# Patient Record
Sex: Female | Born: 1975 | Hispanic: Yes | Marital: Married | Smoking: Never smoker
Health system: Southern US, Community
[De-identification: ages and names within clinical notes are randomized; demographics above are authoritative.]

## PROBLEM LIST (undated history)

## (undated) DIAGNOSIS — O24419 Gestational diabetes mellitus in pregnancy, unspecified control: Secondary | ICD-10-CM

---
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
## 1998-04-22 DIAGNOSIS — O24419 Gestational diabetes mellitus in pregnancy, unspecified control: Secondary | ICD-10-CM

## 1998-04-22 HISTORY — DX: Gestational diabetes mellitus in pregnancy, unspecified control: O24.419

## 2008-04-22 HISTORY — PX: LEEP: SHX91

## 2018-02-19 DIAGNOSIS — F4321 Adjustment disorder with depressed mood: Secondary | ICD-10-CM | POA: Insufficient documentation

## 2018-02-25 DIAGNOSIS — Z87898 Personal history of other specified conditions: Secondary | ICD-10-CM | POA: Insufficient documentation

## 2018-02-25 DIAGNOSIS — E669 Obesity, unspecified: Secondary | ICD-10-CM | POA: Insufficient documentation

## 2018-02-25 DIAGNOSIS — Z62810 Personal history of physical and sexual abuse in childhood: Secondary | ICD-10-CM | POA: Insufficient documentation

## 2018-02-26 ENCOUNTER — Other Ambulatory Visit: Payer: Self-pay | Admitting: Advanced Practice Midwife

## 2018-02-26 DIAGNOSIS — Z3481 Encounter for supervision of other normal pregnancy, first trimester: Secondary | ICD-10-CM

## 2018-02-26 LAB — OB RESULTS CONSOLE HEPATITIS B SURFACE ANTIGEN: Hepatitis B Surface Ag: NEGATIVE

## 2018-02-27 LAB — OB RESULTS CONSOLE GC/CHLAMYDIA
Chlamydia: NEGATIVE
Gonorrhea: NEGATIVE

## 2018-03-04 ENCOUNTER — Other Ambulatory Visit: Payer: Self-pay | Admitting: Advanced Practice Midwife

## 2018-03-04 ENCOUNTER — Ambulatory Visit
Admission: RE | Admit: 2018-03-04 | Discharge: 2018-03-04 | Disposition: A | Discharge status: Home / Self Care | Payer: BLUE CROSS/BLUE SHIELD | Source: Ambulatory Visit | Attending: Advanced Practice Midwife | Admitting: Advanced Practice Midwife

## 2018-03-04 DIAGNOSIS — Z3A12 12 weeks gestation of pregnancy: Secondary | ICD-10-CM | POA: Diagnosis not present

## 2018-03-04 DIAGNOSIS — Z3481 Encounter for supervision of other normal pregnancy, first trimester: Secondary | ICD-10-CM

## 2018-03-04 DIAGNOSIS — O09521 Supervision of elderly multigravida, first trimester: Secondary | ICD-10-CM | POA: Insufficient documentation

## 2018-03-09 ENCOUNTER — Other Ambulatory Visit: Payer: Self-pay | Admitting: Advanced Practice Midwife

## 2018-03-09 DIAGNOSIS — Z369 Encounter for antenatal screening, unspecified: Secondary | ICD-10-CM

## 2018-03-16 ENCOUNTER — Ambulatory Visit
Admission: RE | Admit: 2018-03-16 | Discharge: 2018-03-16 | Disposition: A | Discharge status: Home / Self Care | Payer: BLUE CROSS/BLUE SHIELD | Source: Ambulatory Visit | Attending: Advanced Practice Midwife | Admitting: Advanced Practice Midwife

## 2018-03-16 ENCOUNTER — Ambulatory Visit (HOSPITAL_BASED_OUTPATIENT_CLINIC_OR_DEPARTMENT_OTHER)
Admission: RE | Admit: 2018-03-16 | Discharge: 2018-03-16 | Disposition: A | Discharge status: Home / Self Care | Payer: BLUE CROSS/BLUE SHIELD | Source: Ambulatory Visit | Attending: Maternal & Fetal Medicine | Admitting: Maternal & Fetal Medicine

## 2018-03-16 VITALS — BP 119/78 | HR 85 | Temp 98.0°F | Resp 17 | Ht 60.0 in | Wt 195.0 lb

## 2018-03-16 DIAGNOSIS — Z369 Encounter for antenatal screening, unspecified: Secondary | ICD-10-CM

## 2018-03-16 DIAGNOSIS — Z3A13 13 weeks gestation of pregnancy: Secondary | ICD-10-CM | POA: Diagnosis not present

## 2018-03-16 DIAGNOSIS — Z3689 Encounter for other specified antenatal screening: Secondary | ICD-10-CM | POA: Insufficient documentation

## 2018-03-16 DIAGNOSIS — O09521 Supervision of elderly multigravida, first trimester: Secondary | ICD-10-CM | POA: Diagnosis not present

## 2018-03-16 NOTE — Progress Notes (Signed)
Pt seen by me, agree with assessment and plan as outlined in CGC Wells's note 

## 2018-03-16 NOTE — Progress Notes (Signed)
Referring provider:  [facility] Department Length of consultation:  65 minutes  We had the pleasure of seeing Ms. Gina Bell at [facility] on March 16, 2018. She attended the appointment unaccompanied. A Spanish interpreter was also present for the session. Ms. Gina Bell was referred for advanced maternal age, as she will be 42 years old at the time of delivery. The following is a summary of the topics discussed during the session. The patient was counseled by Leatrice Jewels, genetic counseling intern, supervised by Katrina Stack, MS, CGC.   Family History   As part of the visit, a three-generation pedigree for Ms. Gina Bell and her partner was obtained. The family history is summarized below.   Ms. Gina Bell is G4P3 and will be 42 years old at delivery. Her EDD is 09/15/2018, giving a gestational age of [redacted] weeks and 6 days at the time of the session. She reports that she and her partner are generally in good health. Ms. Gina Bell reports that she has three living children, two boys and one girl. The two boys have children who are alive and well.   Ms. Roque Lias Gallardo's brother has three children. She reports that her oldest niece was born with a "twisted hip" and underwent surgery as a child. She was not aware of a specific cause or diagnosis. It is possible that this represents a congenital dislocation of the hip, and if so, given that this niece is a third degree relative to the pregnancy, we would not anticipate the current pregnancy to be at increased risk for this condition. If additional information regarding the diagnosis or medical records become available, we would be happy to review them.   She also reports that her brother's other daughter was born with Down syndrome. She is not aware of whether her niece or her brother had genetic testing to define the cytogenetic cause of her Down syndrome diagnosis. We discussed that about 95% of  the time Down syndrome is caused by a sporadic genetic event that happened in the formation of the sperm or egg cell and is not associated with a genetic change in the parents. If this were the cause of the niece's Down syndrome, we would not expect this pregnancy to be at an increased risk for Down syndrome beyond her age-related risk. If additional information regarding the diagnosis or medical records become available, we would be happy to review them to exclude the possibility of a translocation which could increase the risk to other family members. The age-related risks for chromosome anomalies are addressed below.   Another of Ms. Roque Lias Gallardo's brothers experienced 5 miscarriages with his partner before the birth of their only child. There can be many causes of miscarriage, and genetic factors can play a role. Given this history, Ms. brother and his partner could seek a genetic counseling evaluation if they would like additional information regarding the possible causes of the recurrent pregnancy losses. Because our patient has not experienced any pregnancy losses, we would not pursue any testing for this family history.   The patient also reports that her partner's mother was diagnosed at 32 with stomach cancer which required surgery and has recently relapsed and required thyroid surgery. She also reports that two of his maternal aunts were diagnosed with uterine cancer in their 37s. We discussed that cancer diagnoses can be sporadic or due to an inherited genetic predisposition. In families with inherited cancer predisposition, it is common for multiple individuals to  be affected at early ages and in multiple generations. If individuals in his family desire, they could seek a consultation with a genetic counselor that specializes in cancer.   The remainder of the family histories are otherwise negative for birth defects, intellectual disability, birth defects, or other diagnosed genetic conditions.  They report [place] heritage with no known Ashkenazi Jewish ancestry. Consanguinity is denied.   Chromosome Anomalies in Pregnancy   During the visit, we also reviewed the options for screening or diagnostic testing of chromosome anomalies in the pregnancy. These screens and tests assess the chance that the pregnancy could be affected by one of several chromosome anomalies, including Down syndrome, trisomy 6313 (also referred to as Patau syndrome), trisomy 6318 (also referred to as Ramon DredgeEdward syndrome), as well as anomalies in the number of the sex chromosomes. While there is the potential for any pregnancy to be affected by these conditions, the chance increases with maternal age. Ms. Gina Mileizano Gallardo will be 42 years old at the time of delivery. Her age-related risk at term for any chromosome anomaly is 1 in 4063 (1.6%), and for Down syndrome in particular is 1 in 98 (1.0%).   First Trimester Screening - this option estimates a numerical probability for chromosome conditions by testing levels of three hormones associated with pregnancy that are present in maternal blood. Specific patterns of these hormones have been associated with a higher likelihood of the incidence of chromosome anomalies, however it is not diagnostic. This information is combined with information from the first trimester ultrasound. The nuchal translucency (NT) measurement is also incorporated to calculate the risk for chromosomal anomalies in the pregnancy. This screen is approximately 90% sensitive for Down syndrome and 95% sensitive for trisomy 5113 and trisomy 6218. It also carries a 5% false positive rate. Abnormal results on this testing would lead to additional diagnostic testing being made available.   Non-invasive prenatal screening - also referred to as cell-free DNA testing (cfDNA). This screen involves sequencing DNA fragments present in the maternal blood. A percentage of these fragments are placental in origin. Sequence fragments  belonging to chromosomes 13, 18, 21, and the sex chromosomes (if desired) are counted. In the case that the measured amount from a chromosome differs from the amount expected, this is highly suggestive of a change in the number of chromosomes in the pregnancy. This screen is >99% sensitive for Down syndrome, >97% sensitive for trisomy 18, and >91% sensitive for trisomy 13. It also carries a 1% false positive rate. While this test is more sensitive for these conditions, it remains a screening test and it is recommended that results from this screen be confirmed with another testing method.   Diagnostic testing - involves obtaining placental or fetal cells through an invasive method, typically chorionic villus sampling (CVS) or amniocentesis, depending on the gestational age. CVS is typically be performed between 10- and 13-weeks gestation. Amniocentesis is typically performed starting at 15-weeks gestation. Cells obtained through these methods can be analyzed via a number of cytogenetic or molecular methods. Chromosomal karyotype can be obtained to detect >99.5% of chromosomal aneuploidy. The invasive methods do carry a risk of pregnancy complications up to and including miscarriage. The rate of complications associated with CVS is approximately 1%, while amniocentesis carries an approximately 0.5% risk of complication.   Carrier screening - we also offered the option of carrier screening for Cystic fibrosis and Spinal Muscular Atrophy per ACOG Guidelines.  The patient had prior hemoglobinopathy screening which was normal.  She declined  screening for CF and SMA.   Assessment and Plan   1. Ms. Gina Bell decided to proceed with non-invasive prenatal screening. Testing should be completed within 7-10 days and we will call the patient to return the results.  2. Ms. Gina Bell declined carrier screening for autosomal recessive conditions. If she would like to pursue testing at a later time, she is  welcome to contact us at any time.   It was a pleasure to meet Ms. Gina Bell today and to participate in her care. Please feel free to contact us if you have any additional questions. We may be reached at [phone].   Cherly Anderson, MS, CGC  Labs ordered:  MaterniT21 PLUS with SCA

## 2018-03-22 LAB — MATERNIT21 PLUS CORE+SCA
CHROMOSOME 13: NEGATIVE
CHROMOSOME 18: NEGATIVE
CHROMOSOME 21: NEGATIVE
Y CHROMOSOME: DETECTED

## 2018-03-23 ENCOUNTER — Telehealth: Payer: Self-pay | Admitting: Obstetrics and Gynecology

## 2018-03-23 NOTE — Telephone Encounter (Signed)
The patient was informed of the results of her recent MaterniT21 testing which yielded NEGATIVE results.  The patient's specimen showed DNA consistent with two copies of chromosomes 21, 18 and 13.  The sensitivity for trisomy 21, trisomy 18 and trisomy 13 using this testing are reported as 99.1%, 99.9% and 91.7% respectively.  Thus, while the results of this testing are highly accurate, they are not considered diagnostic at this time.  Should more definitive information be desired, the patient may still consider amniocentesis.   As requested to know by the patient, sex chromosome analysis was included for this sample.  Results are consistent with a female fetus (Y chromosome material was detected). This is predicted with >99% accuracy.  A maternal serum AFP only should be considered if screening for neural tube defects is desired.  We may be reached at [phone] with any questions or concerns.   Annet Manukyan F. Nate Common, MS, CGC   

## 2018-03-24 ENCOUNTER — Emergency Department
Admission: EM | Admit: 2018-03-24 | Discharge: 2018-03-24 | Disposition: A | Discharge status: Home / Self Care | Payer: BLUE CROSS/BLUE SHIELD | Attending: Emergency Medicine | Admitting: Emergency Medicine

## 2018-03-24 ENCOUNTER — Encounter: Payer: Self-pay | Admitting: Emergency Medicine

## 2018-03-24 ENCOUNTER — Emergency Department: Payer: BLUE CROSS/BLUE SHIELD

## 2018-03-24 ENCOUNTER — Other Ambulatory Visit: Payer: Self-pay

## 2018-03-24 DIAGNOSIS — O219 Vomiting of pregnancy, unspecified: Secondary | ICD-10-CM | POA: Diagnosis not present

## 2018-03-24 DIAGNOSIS — N23 Unspecified renal colic: Secondary | ICD-10-CM

## 2018-03-24 DIAGNOSIS — M545 Low back pain: Secondary | ICD-10-CM | POA: Diagnosis not present

## 2018-03-24 DIAGNOSIS — Z3A15 15 weeks gestation of pregnancy: Secondary | ICD-10-CM | POA: Insufficient documentation

## 2018-03-24 DIAGNOSIS — R1031 Right lower quadrant pain: Secondary | ICD-10-CM | POA: Diagnosis not present

## 2018-03-24 DIAGNOSIS — O9989 Other specified diseases and conditions complicating pregnancy, childbirth and the puerperium: Secondary | ICD-10-CM | POA: Insufficient documentation

## 2018-03-24 LAB — CBC WITH DIFFERENTIAL/PLATELET
Abs Immature Granulocytes: 0.07 10*3/uL (ref 0.00–0.07)
BASOS PCT: 0 %
Basophils Absolute: 0 10*3/uL (ref 0.0–0.1)
EOS ABS: 0.2 10*3/uL (ref 0.0–0.5)
EOS PCT: 1 %
HEMATOCRIT: 38.2 % (ref 36.0–46.0)
Hemoglobin: 13 g/dL (ref 12.0–15.0)
Immature Granulocytes: 1 %
LYMPHS ABS: 1.3 10*3/uL (ref 0.7–4.0)
Lymphocytes Relative: 9 %
MCH: 30.7 pg (ref 26.0–34.0)
MCHC: 34 g/dL (ref 30.0–36.0)
MCV: 90.1 fL (ref 80.0–100.0)
MONO ABS: 0.7 10*3/uL (ref 0.1–1.0)
Monocytes Relative: 5 %
Neutro Abs: 11.9 10*3/uL — ABNORMAL HIGH (ref 1.7–7.7)
Neutrophils Relative %: 84 %
Platelets: 337 10*3/uL (ref 150–400)
RBC: 4.24 MIL/uL (ref 3.87–5.11)
RDW: 14.6 % (ref 11.5–15.5)
WBC: 14.2 10*3/uL — ABNORMAL HIGH (ref 4.0–10.5)
nRBC: 0 % (ref 0.0–0.2)

## 2018-03-24 LAB — COMPREHENSIVE METABOLIC PANEL
ALT: 16 U/L (ref 0–44)
AST: 18 U/L (ref 15–41)
Albumin: 3.5 g/dL (ref 3.5–5.0)
Alkaline Phosphatase: 40 U/L (ref 38–126)
Anion gap: 10 (ref 5–15)
BUN: 7 mg/dL (ref 6–20)
CO2: 21 mmol/L — ABNORMAL LOW (ref 22–32)
CREATININE: 0.6 mg/dL (ref 0.44–1.00)
Calcium: 8.8 mg/dL — ABNORMAL LOW (ref 8.9–10.3)
Chloride: 105 mmol/L (ref 98–111)
GLUCOSE: 126 mg/dL — AB (ref 70–99)
POTASSIUM: 3.7 mmol/L (ref 3.5–5.1)
Sodium: 136 mmol/L (ref 135–145)
Total Bilirubin: 1 mg/dL (ref 0.3–1.2)
Total Protein: 6.9 g/dL (ref 6.5–8.1)

## 2018-03-24 LAB — URINALYSIS, COMPLETE (UACMP) WITH MICROSCOPIC
Bilirubin Urine: NEGATIVE
Glucose, UA: NEGATIVE mg/dL
KETONES UR: 5 mg/dL — AB
Nitrite: NEGATIVE
PROTEIN: 100 mg/dL — AB
Specific Gravity, Urine: 1.024 (ref 1.005–1.030)
pH: 5 (ref 5.0–8.0)

## 2018-03-24 LAB — LIPASE, BLOOD: Lipase: 24 U/L (ref 11–51)

## 2018-03-24 LAB — HCG, QUANTITATIVE, PREGNANCY: hCG, Beta Chain, Quant, S: 77640 m[IU]/mL — ABNORMAL HIGH (ref <5)

## 2018-03-24 MED ORDER — MORPHINE SULFATE (PF) 4 MG/ML IV SOLN
4.0000 mg | Freq: Once | INTRAVENOUS | Status: AC
  Administered 2018-03-24: 4 mg via INTRAVENOUS
  Filled 2018-03-24: qty 1

## 2018-03-24 MED ORDER — CEPHALEXIN 250 MG PO CAPS: 250.0000 mg | ORAL_CAPSULE | Freq: Four times a day (QID) | ORAL | 0 refills | Status: DC

## 2018-03-24 MED ORDER — CEPHALEXIN 250 MG PO CAPS: 250.0000 mg | ORAL_CAPSULE | Freq: Four times a day (QID) | ORAL | 0 refills | Status: AC

## 2018-03-24 MED ORDER — OXYCODONE-ACETAMINOPHEN 5-325 MG PO TABS: 1.0000 | ORAL_TABLET | Freq: Three times a day (TID) | ORAL | 0 refills | Status: DC | PRN

## 2018-03-24 MED ORDER — ONDANSETRON HCL 4 MG/2ML IJ SOLN
4.0000 mg | Freq: Once | INTRAMUSCULAR | Status: AC
  Administered 2018-03-24: 4 mg via INTRAVENOUS
  Filled 2018-03-24: qty 2

## 2018-03-24 NOTE — ED Triage Notes (Signed)
Pt reports being [redacted] weeks pregnant. Pt was woken up at 0600 today with lower right back pain that radiates to her right lower abdomen. Pt states she also has had 3 episodes of emesis. Pt reports this is her 4th pregnancy and denies complications with any other pregnancy. PT denies bleeding/discharge.

## 2018-03-24 NOTE — ED Triage Notes (Signed)
Pt presents with family for lower back pain that radiates around to the front. Pt is [redacted] wks pregnant at this time. Pt doesn't know the name of her OBGYN

## 2018-03-24 NOTE — ED Notes (Signed)
Pt reports pain has gone back up to a 10/10 since the medications were given.

## 2018-03-24 NOTE — ED Provider Notes (Signed)
[facility] Emergency Department Provider Note       Time seen: ----------------------------------------- 9:36 AM on 03/24/2018 -----------------------------------------   I have reviewed the triage vital signs and the nursing notes.  HISTORY   Chief Complaint Back Pain    HPI Gina Bell is a 42 y.o. female with significant past medical history who presents to the ED for new onset right-sided lower back pain that radiates into her right lower abdomen.  She has had 3 episodes of vomiting.  Reports this is her fourth pregnancy, currently she is about [redacted] weeks pregnant.  Had a normal ultrasound about a week ago.  She denies any vaginal bleeding, discharge or leakage of fluid.  History reviewed. No pertinent past medical history.  Patient Active Problem List   Diagnosis Date Noted  . Advanced maternal age in multigravida, first trimester     History reviewed. No pertinent surgical history.  Allergies Patient has no known allergies.  Social History Social History   Tobacco Use  . Smoking status: Never Smoker  . Smokeless tobacco: Never Used  Substance Use Topics  . Alcohol use: Never    Frequency: Never  . Drug use: Never   Review of Systems Constitutional: Negative for fever. Cardiovascular: Negative for chest pain. Respiratory: Negative for shortness of breath. Gastrointestinal: Positive for abdominal pain Genitourinary: Negative for dysuria. negative for vaginal bleeding Musculoskeletal: Positive for right-sided back pain Skin: Negative for rash. Neurological: Negative for headaches, focal weakness or numbness.  All systems negative/normal/unremarkable except as stated in the HPI  ____________________________________________   PHYSICAL EXAM:  VITAL SIGNS: ED Triage Vitals  Enc Vitals Group     BP --      Pulse --      Resp --      Temp --      Temp src --      SpO2 03/24/18 0837 99 %     Weight --      Height  --      Head Circumference --      Peak Flow --      Pain Score 03/24/18 0850 10     Pain Loc --      Pain Edu? --      Excl. in GC? --    Constitutional: Alert and oriented.  Mild distress from pain Eyes: Conjunctivae are normal. Normal extraocular movements. Cardiovascular: Normal rate, regular rhythm. No murmurs, rubs, or gallops. Respiratory: Normal respiratory effort without tachypnea nor retractions. Breath sounds are clear and equal bilaterally. No wheezes/rales/rhonchi. Gastrointestinal: Mild right flank tenderness, normal bowel sounds Musculoskeletal: Nontender with normal range of motion in extremities. No lower extremity tenderness nor edema. Neurologic:  Normal speech and language. No gross focal neurologic deficits are appreciated.  Skin:  Skin is warm, dry and intact. No rash noted. Psychiatric: Mood and affect are normal. Speech and behavior are normal.  ____________________________________________  ED COURSE:  As part of my medical decision making, I reviewed the following data within the electronic MEDICAL RECORD NUMBER History obtained from family if available, nursing notes, old chart and ekg, as well as notes from prior ED visits. Patient presented for right flank pain in the second trimester pregnancy, we will assess with labs and imaging as indicated at this time. Clinical Course as of Mar 24 1132  Tue Mar 24, 2018  0957 Labs likely indicative of renal colic   [JW]    Clinical Course User Index [JW] Emily Filbert, MD   Procedures  ____________________________________________   LABS (pertinent positives/negatives)  Labs Reviewed  CBC WITH DIFFERENTIAL/PLATELET - Abnormal; Notable for the following components:      Result Value   WBC 14.2 (*)    Neutro Abs 11.9 (*)    All other components within normal limits  COMPREHENSIVE METABOLIC PANEL - Abnormal; Notable for the following components:   CO2 21 (*)    Glucose, Bld 126 (*)    Calcium 8.8 (*)    All  other components within normal limits  URINALYSIS, COMPLETE (UACMP) WITH MICROSCOPIC - Abnormal; Notable for the following components:   Color, Urine AMBER (*)    APPearance CLOUDY (*)    Hgb urine dipstick LARGE (*)    Ketones, ur 5 (*)    Protein, ur 100 (*)    Leukocytes, UA SMALL (*)    RBC / HPF >50 (*)    Bacteria, UA RARE (*)    All other components within normal limits  HCG, QUANTITATIVE, PREGNANCY - Abnormal; Notable for the following components:   hCG, Beta Chain, Quant, S 77,640 (*)    All other components within normal limits  URINE CULTURE  LIPASE, BLOOD    RADIOLOGY Images were viewed by me  Ultrasound renal IMPRESSION: Mild right hydronephrosis. Distal ureteral obstruction cannot be excluded. CT urogram may be performed further evaluation. ____________________________________________  DIFFERENTIAL DIAGNOSIS   Renal colic, UTI, pyelonephritis, dehydration, electrolyte of normality, pregnancy, cholelithiasis, cholecystitis  FINAL ASSESSMENT AND PLAN  Flank pain, renal colic   Plan: The patient had presented for right flank pain in the second trimester. Patient's labs revealed mild leukocytosis likely secondary to pregnancy and to due renal colic. Patient's imaging revealed mild right hydronephrosis likely secondary to kidney stone.  Pain is somewhat improved.  I will discuss with urology and she will have close outpatient follow-up.   Ulice DashJohnathan E Roanna Reaves, MD   Note: This note was generated in part or whole with voice recognition software. Voice recognition is usually quite accurate but there are transcription errors that can and very often do occur. I apologize for any typographical errors that were not detected and corrected.     Emily FilbertWilliams, Gina Bell E, MD 03/24/18 1134

## 2018-03-24 NOTE — ED Notes (Signed)
To Ultrasound

## 2018-03-25 LAB — URINE CULTURE

## 2018-04-05 NOTE — Progress Notes (Signed)
Pt seen by me, agree with assessment and plan as outlined.  

## 2018-04-13 ENCOUNTER — Other Ambulatory Visit: Payer: Self-pay

## 2018-04-13 DIAGNOSIS — O09521 Supervision of elderly multigravida, first trimester: Secondary | ICD-10-CM

## 2018-04-16 ENCOUNTER — Ambulatory Visit
Admission: RE | Admit: 2018-04-16 | Discharge: 2018-04-16 | Disposition: A | Discharge status: Home / Self Care | Payer: BLUE CROSS/BLUE SHIELD | Source: Ambulatory Visit | Attending: Maternal & Fetal Medicine | Admitting: Maternal & Fetal Medicine

## 2018-04-16 DIAGNOSIS — Z3A18 18 weeks gestation of pregnancy: Secondary | ICD-10-CM | POA: Diagnosis not present

## 2018-04-16 DIAGNOSIS — O09522 Supervision of elderly multigravida, second trimester: Secondary | ICD-10-CM | POA: Insufficient documentation

## 2018-04-16 DIAGNOSIS — O09521 Supervision of elderly multigravida, first trimester: Secondary | ICD-10-CM

## 2018-04-22 NOTE — L&D Delivery Note (Signed)
Operative Delivery Note At  a viable and female  was delivered via . VAVD kiwi Presentation: vertex; Position: Right,, Occiput,, Anterior; Station: +3/3.  Verbal consent: obtained from patient.  Risks and benefits discussed in detail.  Risks include, but are not limited to the risks of anesthesia, bleeding, infection, damage to maternal tissues, fetal cephalhematoma.  There is also the risk of inability to effect vaginal delivery of the head, or shoulder dystocia that cannot be resolved by established maneuvers, leading to the need for emergency cesarean section.  recurrent fetal late deceleration with suboptimal pushing effort by mother due to CLE . Kiwi applied and with 1 push the head was delivered and the vacuum was removed . Marland Kitchen The rest of the body was delivered without difficulty .  APGAR:8/9 , ; weight  .  3150 gm Placenta status: intact , .   Cord: 3v with the following complications: recurrent terminal late decels  .  Cord pH: none  Anesthesia:  cle Instruments: kiwi bell Episiotomy:  none Lacerations:  small first degree laceration  Suture Repair: 3.0 vicryl Est. Blood Loss (mL):  200 cc  Mom to postpartum.  Baby to Couplet care / Skin to Skin.  Gina Bell 09/14/2018, 4:33 PM

## 2018-05-21 DIAGNOSIS — N200 Calculus of kidney: Secondary | ICD-10-CM | POA: Insufficient documentation

## 2018-06-18 LAB — HM HIV SCREENING LAB: HM HIV Screening: NEGATIVE

## 2018-06-18 LAB — OB RESULTS CONSOLE HIV ANTIBODY (ROUTINE TESTING): HIV: NONREACTIVE

## 2018-06-19 LAB — OB RESULTS CONSOLE RPR: RPR: NONREACTIVE

## 2018-06-29 LAB — OB RESULTS CONSOLE VARICELLA ZOSTER ANTIBODY, IGG: Varicella: IMMUNE

## 2018-06-29 LAB — OB RESULTS CONSOLE RUBELLA ANTIBODY, IGM: Rubella: IMMUNE

## 2018-07-06 ENCOUNTER — Other Ambulatory Visit: Payer: Self-pay

## 2018-07-06 DIAGNOSIS — O09521 Supervision of elderly multigravida, first trimester: Secondary | ICD-10-CM

## 2018-07-09 ENCOUNTER — Ambulatory Visit
Admission: RE | Admit: 2018-07-09 | Discharge: 2018-07-09 | Disposition: A | Discharge status: Home / Self Care | Payer: BLUE CROSS/BLUE SHIELD | Source: Ambulatory Visit | Attending: Maternal & Fetal Medicine | Admitting: Maternal & Fetal Medicine

## 2018-07-09 ENCOUNTER — Other Ambulatory Visit: Payer: Self-pay

## 2018-07-09 DIAGNOSIS — Z3A30 30 weeks gestation of pregnancy: Secondary | ICD-10-CM | POA: Insufficient documentation

## 2018-07-09 DIAGNOSIS — O09522 Supervision of elderly multigravida, second trimester: Secondary | ICD-10-CM | POA: Insufficient documentation

## 2018-07-09 DIAGNOSIS — O09521 Supervision of elderly multigravida, first trimester: Secondary | ICD-10-CM

## 2018-09-07 ENCOUNTER — Other Ambulatory Visit: Payer: Self-pay | Admitting: Obstetrics and Gynecology

## 2018-09-07 ENCOUNTER — Encounter: Payer: Self-pay | Admitting: Obstetrics and Gynecology
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
## 2018-09-11 ENCOUNTER — Other Ambulatory Visit: Payer: Self-pay

## 2018-09-11 ENCOUNTER — Ambulatory Visit
Admission: RE | Admit: 2018-09-11 | Discharge: 2018-09-11 | Disposition: A | Discharge status: Home / Self Care | Payer: HRSA Program | Source: Ambulatory Visit | Attending: Certified Nurse Midwife | Admitting: Certified Nurse Midwife

## 2018-09-11 DIAGNOSIS — Z01812 Encounter for preprocedural laboratory examination: Secondary | ICD-10-CM | POA: Diagnosis not present

## 2018-09-11 DIAGNOSIS — Z1159 Encounter for screening for other viral diseases: Secondary | ICD-10-CM | POA: Insufficient documentation

## 2018-09-12 LAB — NOVEL CORONAVIRUS, NAA (HOSP ORDER, SEND-OUT TO REF LAB; TAT 18-24 HRS): SARS-CoV-2, NAA: NOT DETECTED

## 2018-09-14 ENCOUNTER — Inpatient Hospital Stay
Admission: EM | Admit: 2018-09-14 | Discharge: 2018-09-15 | DRG: 806 | Disposition: A | Discharge status: Home / Self Care | Payer: BLUE CROSS/BLUE SHIELD | Attending: Obstetrics and Gynecology | Admitting: Obstetrics and Gynecology

## 2018-09-14 ENCOUNTER — Inpatient Hospital Stay: Payer: BLUE CROSS/BLUE SHIELD | Admitting: Anesthesiology

## 2018-09-14 ENCOUNTER — Other Ambulatory Visit: Payer: Self-pay

## 2018-09-14 DIAGNOSIS — Z3A39 39 weeks gestation of pregnancy: Secondary | ICD-10-CM

## 2018-09-14 DIAGNOSIS — O99824 Streptococcus B carrier state complicating childbirth: Principal | ICD-10-CM | POA: Diagnosis present

## 2018-09-14 DIAGNOSIS — O26893 Other specified pregnancy related conditions, third trimester: Secondary | ICD-10-CM | POA: Diagnosis present

## 2018-09-14 DIAGNOSIS — O9081 Anemia of the puerperium: Secondary | ICD-10-CM | POA: Diagnosis not present

## 2018-09-14 DIAGNOSIS — D62 Acute posthemorrhagic anemia: Secondary | ICD-10-CM | POA: Diagnosis not present

## 2018-09-14 LAB — TYPE AND SCREEN
ABO/RH(D): O POS
Antibody Screen: NEGATIVE

## 2018-09-14 LAB — CBC
HCT: 32.8 % — ABNORMAL LOW (ref 36.0–46.0)
Hemoglobin: 11 g/dL — ABNORMAL LOW (ref 12.0–15.0)
MCH: 31.3 pg (ref 26.0–34.0)
MCHC: 33.5 g/dL (ref 30.0–36.0)
MCV: 93.4 fL (ref 80.0–100.0)
Platelets: 309 10*3/uL (ref 150–400)
RBC: 3.51 MIL/uL — ABNORMAL LOW (ref 3.87–5.11)
RDW: 13.3 % (ref 11.5–15.5)
WBC: 10.1 10*3/uL (ref 4.0–10.5)
nRBC: 0 % (ref 0.0–0.2)

## 2018-09-14 MED ORDER — EPHEDRINE 5 MG/ML INJ: 10.0000 mg | INTRAVENOUS | Status: DC | PRN

## 2018-09-14 MED ORDER — MISOPROSTOL 200 MCG PO TABS
ORAL_TABLET | ORAL | Status: AC
  Filled 2018-09-14: qty 4

## 2018-09-14 MED ORDER — SODIUM CHLORIDE 0.9 % IV SOLN
INTRAVENOUS | Status: DC | PRN
  Administered 2018-09-14 (×2): 5 mL via EPIDURAL

## 2018-09-14 MED ORDER — SIMETHICONE 80 MG PO CHEW: 80.0000 mg | CHEWABLE_TABLET | ORAL | Status: DC | PRN

## 2018-09-14 MED ORDER — PRENATAL MULTIVITAMIN CH
1.0000 | ORAL_TABLET | Freq: Every day | ORAL | Status: DC
  Administered 2018-09-15: 1 via ORAL
  Filled 2018-09-14: qty 1

## 2018-09-14 MED ORDER — DIBUCAINE (PERIANAL) 1 % EX OINT: 1.0000 "application " | TOPICAL_OINTMENT | CUTANEOUS | Status: DC | PRN

## 2018-09-14 MED ORDER — DIPHENHYDRAMINE HCL 50 MG/ML IJ SOLN: 12.5000 mg | INTRAMUSCULAR | Status: DC | PRN

## 2018-09-14 MED ORDER — ONDANSETRON HCL 4 MG/2ML IJ SOLN: 4.0000 mg | Freq: Four times a day (QID) | INTRAMUSCULAR | Status: DC | PRN

## 2018-09-14 MED ORDER — ONDANSETRON HCL 4 MG/2ML IJ SOLN: 4.0000 mg | INTRAMUSCULAR | Status: DC | PRN

## 2018-09-14 MED ORDER — OXYTOCIN 40 UNITS IN NORMAL SALINE INFUSION - SIMPLE MED
2.5000 [IU]/h | INTRAVENOUS | Status: DC
  Filled 2018-09-14: qty 1000

## 2018-09-14 MED ORDER — ACETAMINOPHEN 325 MG PO TABS
650.0000 mg | ORAL_TABLET | ORAL | Status: DC | PRN
  Administered 2018-09-15: 650 mg via ORAL
  Filled 2018-09-14 (×2): qty 2

## 2018-09-14 MED ORDER — OXYTOCIN BOLUS FROM INFUSION
500.0000 mL | Freq: Once | INTRAVENOUS | Status: AC
  Administered 2018-09-14: 500 mL via INTRAVENOUS

## 2018-09-14 MED ORDER — ACETAMINOPHEN 325 MG PO TABS: 650.0000 mg | ORAL_TABLET | ORAL | Status: DC | PRN

## 2018-09-14 MED ORDER — AMMONIA AROMATIC IN INHA
RESPIRATORY_TRACT | Status: AC
  Filled 2018-09-14: qty 10

## 2018-09-14 MED ORDER — IBUPROFEN 600 MG PO TABS
600.0000 mg | ORAL_TABLET | Freq: Four times a day (QID) | ORAL | Status: DC
  Administered 2018-09-14 – 2018-09-15 (×4): 600 mg via ORAL
  Filled 2018-09-14 (×5): qty 1

## 2018-09-14 MED ORDER — SODIUM CHLORIDE 0.9 % IV SOLN
5.0000 10*6.[IU] | Freq: Once | INTRAVENOUS | Status: AC
  Administered 2018-09-14: 5 10*6.[IU] via INTRAVENOUS
  Filled 2018-09-14: qty 5

## 2018-09-14 MED ORDER — MEASLES, MUMPS & RUBELLA VAC IJ SOLR
0.5000 mL | Freq: Once | INTRAMUSCULAR | Status: DC
  Filled 2018-09-14: qty 0.5

## 2018-09-14 MED ORDER — LIDOCAINE HCL (PF) 1 % IJ SOLN
INTRAMUSCULAR | Status: AC
  Filled 2018-09-14: qty 30

## 2018-09-14 MED ORDER — LACTATED RINGERS IV SOLN
500.0000 mL | INTRAVENOUS | Status: DC | PRN
  Administered 2018-09-14 (×2): 500 mL via INTRAVENOUS

## 2018-09-14 MED ORDER — LIDOCAINE HCL (PF) 1 % IJ SOLN
INTRAMUSCULAR | Status: DC | PRN
  Administered 2018-09-14: 5 mL via SUBCUTANEOUS

## 2018-09-14 MED ORDER — MISOPROSTOL 25 MCG QUARTER TABLET
25.0000 ug | ORAL_TABLET | Freq: Once | ORAL | Status: AC
  Administered 2018-09-14: 25 ug via BUCCAL
  Filled 2018-09-14: qty 1

## 2018-09-14 MED ORDER — PHENYLEPHRINE 40 MCG/ML (10ML) SYRINGE FOR IV PUSH (FOR BLOOD PRESSURE SUPPORT): 80.0000 ug | PREFILLED_SYRINGE | INTRAVENOUS | Status: DC | PRN

## 2018-09-14 MED ORDER — DIPHENHYDRAMINE HCL 25 MG PO CAPS: 25.0000 mg | ORAL_CAPSULE | Freq: Four times a day (QID) | ORAL | Status: DC | PRN

## 2018-09-14 MED ORDER — LACTATED RINGERS IV SOLN: 500.0000 mL | Freq: Once | INTRAVENOUS | Status: AC

## 2018-09-14 MED ORDER — TERBUTALINE SULFATE 1 MG/ML IJ SOLN: 0.2500 mg | Freq: Once | INTRAMUSCULAR | Status: DC | PRN

## 2018-09-14 MED ORDER — LIDOCAINE-EPINEPHRINE (PF) 1.5 %-1:200000 IJ SOLN
INTRAMUSCULAR | Status: DC | PRN
  Administered 2018-09-14: 3 mL via EPIDURAL

## 2018-09-14 MED ORDER — BENZOCAINE-MENTHOL 20-0.5 % EX AERO
1.0000 "application " | INHALATION_SPRAY | CUTANEOUS | Status: DC | PRN
  Filled 2018-09-14: qty 56

## 2018-09-14 MED ORDER — LIDOCAINE HCL (PF) 1 % IJ SOLN: 30.0000 mL | INTRAMUSCULAR | Status: DC | PRN

## 2018-09-14 MED ORDER — FENTANYL 2.5 MCG/ML W/ROPIVACAINE 0.15% IN NS 100 ML EPIDURAL (ARMC)
EPIDURAL | Status: AC
  Filled 2018-09-14: qty 100

## 2018-09-14 MED ORDER — SOD CITRATE-CITRIC ACID 500-334 MG/5ML PO SOLN: 30.0000 mL | ORAL | Status: DC | PRN

## 2018-09-14 MED ORDER — OXYCODONE HCL 5 MG PO TABS: 5.0000 mg | ORAL_TABLET | ORAL | Status: DC | PRN

## 2018-09-14 MED ORDER — FERROUS SULFATE 325 (65 FE) MG PO TABS
325.0000 mg | ORAL_TABLET | Freq: Two times a day (BID) | ORAL | Status: DC
  Administered 2018-09-15: 325 mg via ORAL
  Filled 2018-09-14: qty 1

## 2018-09-14 MED ORDER — SENNOSIDES-DOCUSATE SODIUM 8.6-50 MG PO TABS: 2.0000 | ORAL_TABLET | ORAL | Status: DC

## 2018-09-14 MED ORDER — MAGNESIUM HYDROXIDE 400 MG/5ML PO SUSP: 30.0000 mL | ORAL | Status: DC | PRN

## 2018-09-14 MED ORDER — PENICILLIN G 3 MILLION UNITS IVPB - SIMPLE MED
3.0000 10*6.[IU] | INTRAVENOUS | Status: DC
  Administered 2018-09-14 (×2): 3 10*6.[IU] via INTRAVENOUS
  Filled 2018-09-14: qty 100
  Filled 2018-09-14: qty 3
  Filled 2018-09-14 (×4): qty 100
  Filled 2018-09-14: qty 3
  Filled 2018-09-14: qty 100

## 2018-09-14 MED ORDER — FENTANYL 2.5 MCG/ML W/ROPIVACAINE 0.15% IN NS 100 ML EPIDURAL (ARMC)
EPIDURAL | Status: DC | PRN
  Administered 2018-09-14: 12 mL/h via EPIDURAL

## 2018-09-14 MED ORDER — ONDANSETRON HCL 4 MG PO TABS: 4.0000 mg | ORAL_TABLET | ORAL | Status: DC | PRN

## 2018-09-14 MED ORDER — COCONUT OIL OIL: 1.0000 "application " | TOPICAL_OIL | Status: DC | PRN

## 2018-09-14 MED ORDER — OXYCODONE HCL 5 MG PO TABS: 10.0000 mg | ORAL_TABLET | ORAL | Status: DC | PRN

## 2018-09-14 MED ORDER — BUTORPHANOL TARTRATE 2 MG/ML IJ SOLN
1.0000 mg | INTRAMUSCULAR | Status: DC | PRN
  Administered 2018-09-14: 1 mg via INTRAVENOUS
  Filled 2018-09-14: qty 1

## 2018-09-14 MED ORDER — FENTANYL 2.5 MCG/ML W/ROPIVACAINE 0.15% IN NS 100 ML EPIDURAL (ARMC): 12.0000 mL/h | EPIDURAL | Status: DC

## 2018-09-14 MED ORDER — MISOPROSTOL 25 MCG QUARTER TABLET
25.0000 ug | ORAL_TABLET | ORAL | Status: DC | PRN
  Administered 2018-09-14: 25 ug via VAGINAL

## 2018-09-14 MED ORDER — WITCH HAZEL-GLYCERIN EX PADS: 1.0000 "application " | MEDICATED_PAD | CUTANEOUS | Status: DC | PRN

## 2018-09-14 MED ORDER — OXYTOCIN 10 UNIT/ML IJ SOLN
INTRAMUSCULAR | Status: AC
  Filled 2018-09-14: qty 2

## 2018-09-14 MED ORDER — LACTATED RINGERS IV SOLN
INTRAVENOUS | Status: DC
  Administered 2018-09-14 (×2): via INTRAVENOUS

## 2018-09-14 MED ORDER — ZOLPIDEM TARTRATE 5 MG PO TABS: 5.0000 mg | ORAL_TABLET | Freq: Every evening | ORAL | Status: DC | PRN

## 2018-09-14 NOTE — Progress Notes (Signed)
Gina Bell is a 43 y.o. G4P3003 at [redacted]w[redacted]d  Subjective: Ctx painful  2 doses abx in   Objective: BP 128/77 (BP Location: Left Arm)   Pulse 76   Temp 98.2 F (36.8 C) (Oral)   Resp 17   Ht 5' (1.524 m)   Wt 88.9 kg   LMP 12/09/2017   BMI 38.28 kg/m  No intake/output data recorded. No intake/output data recorded.  FHT:  FHR: 120 bpm, variability: moderate,  accelerations:  Present,  decelerations:  Absent UC:   regular, every 2 minutes SVE:  2 cm / 70/ -1 vtx arom clear  Labs: Lab Results  Component Value Date   WBC 10.1 09/14/2018   HGB 11.0 (L) 09/14/2018   HCT 32.8 (L) 09/14/2018   MCV 93.4 09/14/2018   PLT 309 09/14/2018    Assessment / Plan: arom   reassuring fetal monitoring  Anticipate SVD Ihor Austin Lucila Klecka 09/14/2018, 11:15 AM

## 2018-09-14 NOTE — Discharge Summary (Signed)
Obstetrical Discharge Summary  Patient Name: Gina Bell DOB: 01/19/1976 MRN: [id number]  Date of Admission: 09/14/2018 Date of Delivery: 09/14/2018 Delivered by: Beverly Gust Schermerhorn MD Date of Discharge: 09/15/2018  Primary OB:  [facility] WUJ:[id number]:Patient's last menstrual period was 12/09/2017. EDC Estimated Date of Delivery: 09/15/18 Gestational Age at Delivery: 788w6d   Antepartum complications:AMA Admitting Diagnosis: induction , + GBS  Secondary Diagnosis: Patient Active Problem List   Diagnosis Date Noted  . Labor and delivery indication for care or intervention 09/14/2018  . Advanced maternal age in multigravida, first trimester     Augmentation/ Induction Cytotec Complications: None Intrapartum complications/course: recurrent terminal lates decels  Date of Delivery:  Delivered By: Beverly Gust. Schermerhorn MD Delivery Type: vacuum, low Anesthesia: epidural Placenta: spontaneous Laceration: small first degree Episiotomy: none Newborn Data: Live born female  Birth Weight: 6 lb 15.1 oz (3150 g) APGAR: 8, 9  Newborn Delivery   Birth date/time:  09/14/2018 14:59:00 Delivery type:  Vaginal, Vacuum (Extractor)       Postpartum Procedures: n/a  Post partum course:  Patient had an uncomplicated postpartum course.  By time of discharge on PPD#1, her pain was controlled on oral pain medications; she had appropriate lochia and was ambulating, voiding without difficulty and tolerating regular diet.  She was deemed stable for discharge to home.    She did have rectal pain but a normal exam and no evidence of perineal infection   Discharge Physical Exam:  BP 111/81 (BP Location: Left Arm)   Pulse (!) 59   Temp 98 F (36.7 C)   Resp 20   Ht 5' (1.524 m)   Wt 88.9 kg   LMP 12/09/2017   SpO2 98%   Breastfeeding Unknown   BMI 38.28 kg/m   General: alert and cooperative Lochia: appropriate Uterine Fundus: firm Anus: no evidence tearing or infection, normal lochia, small  perineal tear DVT Evaluation: No evidence of DVT seen on physical exam.  Hemoglobin  Date Value Ref Range Status  09/15/2018 10.6 (L) 12.0 - 15.0 g/dL Final   HCT  Date Value Ref Range Status  09/15/2018 31.7 (L) 36.0 - 46.0 % Final     Disposition: stable, discharge to home. Baby Feeding: breastmilk Baby Disposition: home with mom Contraception: desires BTL // paperwork Medicaid ?  Prenatal Labs:  ABO, Rh: --/--/O POS (05/25 [id number]) Antibody: NEG (05/25 0504) Rubella:  imm, Varicella IMM RPR:   nr HBsAg:   neg HIV:   neg GBS:   no record - treat as positive   Plan:  Gina Bell was discharged to home in good condition. Follow-up appointment with delivering provider in 2 weeks to consider BTL  Discharge Medications: Allergies as of 09/15/2018   No Known Allergies     Medication List    TAKE these medications   docusate sodium 100 MG capsule Commonly known as:  Colace Take 1 capsule (100 mg total) by mouth 2 (two) times daily for 14 days. To keep stools soft, as needed   ferrous sulfate 325 (65 FE) MG tablet Take 1 tablet (325 mg total) by mouth daily with breakfast. Take with Vitamin C   ibuprofen 800 MG tablet Commonly known as:  ADVIL Take 1 tablet (800 mg total) by mouth every 8 (eight) hours as needed for moderate pain or cramping.   prenatal multivitamin Tabs tablet Take 1 tablet by mouth daily at 12 noon.       Follow-up Information    Schermerhorn, Ihor Austinhomas J, MD Follow up in 6 week(s).  Specialty:  Obstetrics and Gynecology Why:  post psrtum care  Contact information: [address] [place] [address] [phone]           Signed: Christeen Douglas 09/15/18

## 2018-09-14 NOTE — Anesthesia Preprocedure Evaluation (Signed)
Anesthesia Evaluation  Patient identified by MRN, date of birth, ID band Patient awake    Reviewed: Allergy & Precautions, H&P , NPO status , Patient's Chart, lab work & pertinent test results, reviewed documented beta blocker date and time   History of Anesthesia Complications Negative for: history of anesthetic complications  Airway Mallampati: III  TM Distance: >3 FB Neck ROM: full    Dental no notable dental hx.    Pulmonary neg pulmonary ROS,    Pulmonary exam normal        Cardiovascular Exercise Tolerance: Good negative cardio ROS Normal cardiovascular exam     Neuro/Psych negative neurological ROS  negative psych ROS   GI/Hepatic Neg liver ROS, GERD  ,  Endo/Other  negative endocrine ROS  Renal/GU negative Renal ROS  negative genitourinary   Musculoskeletal   Abdominal   Peds  Hematology negative hematology ROS (+)   Anesthesia Other Findings History reviewed. No pertinent past medical history.   Reproductive/Obstetrics (+) Pregnancy                             Anesthesia Physical Anesthesia Plan  ASA: II  Anesthesia Plan: Epidural   Post-op Pain Management:    Induction:   PONV Risk Score and Plan:   Airway Management Planned:   Additional Equipment:   Intra-op Plan:   Post-operative Plan:   Informed Consent: I have reviewed the patients History and Physical, chart, labs and discussed the procedure including the risks, benefits and alternatives for the proposed anesthesia with the patient or authorized representative who has indicated his/her understanding and acceptance.     Dental Advisory Given  Plan Discussed with: Anesthesiologist, CRNA and Surgeon  Anesthesia Plan Comments:         Anesthesia Quick Evaluation

## 2018-09-14 NOTE — Anesthesia Procedure Notes (Signed)
Epidural Patient location during procedure: OB Start time: 09/14/2018 1:27 PM End time: 09/14/2018 1:47 PM  Staffing Anesthesiologist: Lenard Simmer, MD Performed: anesthesiologist   Preanesthetic Checklist Completed: patient identified, site marked, surgical consent, pre-op evaluation, timeout performed, IV checked, risks and benefits discussed and monitors and equipment checked  Epidural Patient position: sitting Prep: ChloraPrep Patient monitoring: heart rate, continuous pulse ox and blood pressure Approach: midline Location: L3-L4 Injection technique: LOR saline  Needle:  Needle type: Tuohy  Needle gauge: 17 G Needle length: 9 cm and 9 Needle insertion depth: 6 cm Catheter type: closed end flexible Catheter size: 19 Gauge Catheter at skin depth: 11 cm Test dose: negative and 1.5% lidocaine with Epi 1:200 K  Assessment Sensory level: T10 Events: blood not aspirated, injection not painful, no injection resistance, negative IV test and no paresthesia  Additional Notes 1st attempt Pt. Evaluated and documentation done after procedure finished. Patient identified. Risks/Benefits/Options discussed with patient including but not limited to bleeding, infection, nerve damage, paralysis, failed block, incomplete pain control, headache, blood pressure changes, nausea, vomiting, reactions to medication both or allergic, itching and postpartum back pain. Confirmed with bedside nurse the patient's most recent platelet count. Confirmed with patient that they are not currently taking any anticoagulation, have any bleeding history or any family history of bleeding disorders. Patient expressed understanding and wished to proceed. All questions were answered. Sterile technique was used throughout the entire procedure. Please see nursing notes for vital signs. Test dose was given through epidural catheter and negative prior to continuing to dose epidural or start infusion. Warning signs of high  block given to the patient including shortness of breath, tingling/numbness in hands, complete motor block, or any concerning symptoms with instructions to call for help. Patient was given instructions on fall risk and not to get out of bed. All questions and concerns addressed with instructions to call with any issues or inadequate analgesia.   Patient tolerated the insertion well without immediate complications.Reason for block:procedure for pain

## 2018-09-14 NOTE — Lactation Note (Signed)
This note was copied from a baby's chart. Lactation Consultation Note  Patient Name: Gina Bell [id number] Date: 09/14/2018   Mom only speaks Spanish.  Communicated with mom through father of baby and Spanish interpreter.  Mom says she wants to breast and bottlefeed.  Through interpreter she expresses desire to formula feed for now and breast feed when milk comes in.  Mom reports breast feeding first baby for 6 months.  Reports could not breast feed second baby because was bit by scorpion and had no milk.  With 41 year old, she breast fed for 3 to 4 months.  She kept saying she had no milk until after 4th day with all of her babies.  Hand expressed colostrum demonstrating she had some milk.  Explained that she only had small amounts until mature milk transitioned in, but that was enough for [name] in beginning d/t small stomach size.  Reviewed supply and demand and need to begin stimulating breast by putting [name] to the breast to bring in mature milk and ensure a plentiful milk supply.  Reviewed feeding cues, normal course of lactation and routine newborn feeding patterns.  Mom still insists on giving 20 ml of formula via bottle against lactation's advice.  Encouraged mom to call if needed assistance.  Maternal Data    Feeding Feeding Type: Bottle Fed - Formula Nipple Type: Slow - flow  LATCH Score                   Interventions    Lactation Tools Discussed/Used     Consult Status      Louis Meckel 09/14/2018, 10:22 PM

## 2018-09-14 NOTE — H&P (Signed)
Gina Bell is a 43 y.o. female presenting for induction of labor . 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 Paperwork , pt states she didn't sign H/o seizure , urinary incontinence, [place] 2018 - no record of work up . Cervical LEEP     OB History    Gravida  4   Para  3   Term  3   Preterm      AB      Living  3     SAB      TAB      Ectopic      Multiple      Live Births  3          History reviewed. No pertinent past medical history. Past Surgical History:  Procedure Laterality Date  . LEEP  2010   Family History: family history includes Diabetes in her father, paternal grandfather, and paternal grandmother; Heart disease in her paternal grandfather and paternal grandmother; Hypertension in her mother. Social History:  reports that she has never smoked. She has never used smokeless tobacco. She reports that she does not drink alcohol or use drugs.     Maternal Diabetes: No Genetic Screening: Normal Maternal Ultrasounds/Referrals: Normal Fetal Ultrasounds or other Referrals:  None Maternal Substance Abuse:  No Significant Maternal Medications:  None Significant Maternal Lab Results:  None Other Comments:  None  Review of Systems  Respiratory: Positive for hemoptysis.    History Dilation: 1 Effacement (%): 50 Station: -2 Exam by:: Oather Muilenburg MD Blood pressure 128/77, pulse 76, temperature 98.2 F (36.8 C), temperature source Oral, resp. rate 17, height 5' (1.524 m), weight 88.9 kg, last menstrual period 12/09/2017. Exam Physical Exam   Lungs CTA   CV RRR  abd : gravid non tender EFM : 120 + accels , good variability  , no decels . Ctx q2- 3 min  Prenatal labs: ABO, Rh: --/--/O POS (05/25 2694) Antibody: NEG (05/25 0504) Rubella:  imm, Varicella IMM RPR:   nr HBsAg:   neg HIV:   neg GBS:   no record - treat as positive  Assessment/Plan: Induction of labor  Reassuring fetal monitoring   GBS prophylaxis  Sterilization elected , no Medicaid paperwork Cytotec 25 mcg vag + po placed     [employer] 09/14/2018, 8:24 AM

## 2018-09-15 LAB — CBC
HCT: 31.7 % — ABNORMAL LOW (ref 36.0–46.0)
Hemoglobin: 10.6 g/dL — ABNORMAL LOW (ref 12.0–15.0)
MCH: 31.4 pg (ref 26.0–34.0)
MCHC: 33.4 g/dL (ref 30.0–36.0)
MCV: 93.8 fL (ref 80.0–100.0)
Platelets: 266 10*3/uL (ref 150–400)
RBC: 3.38 MIL/uL — ABNORMAL LOW (ref 3.87–5.11)
RDW: 13.4 % (ref 11.5–15.5)
WBC: 14.4 10*3/uL — ABNORMAL HIGH (ref 4.0–10.5)
nRBC: 0 % (ref 0.0–0.2)

## 2018-09-15 MED ORDER — IBUPROFEN 800 MG PO TABS: 800.0000 mg | ORAL_TABLET | Freq: Three times a day (TID) | ORAL | 1 refills | Status: AC | PRN

## 2018-09-15 MED ORDER — FERROUS SULFATE 325 (65 FE) MG PO TABS: 325.0000 mg | ORAL_TABLET | Freq: Every day | ORAL | 1 refills | Status: AC

## 2018-09-15 MED ORDER — DOCUSATE SODIUM 100 MG PO CAPS: 100.0000 mg | ORAL_CAPSULE | Freq: Two times a day (BID) | ORAL | 0 refills | Status: AC

## 2018-09-15 NOTE — Progress Notes (Signed)
09/15/2018  BP 111/81 (BP Location: Left Arm)   Pulse (!) 59   Temp 98 F (36.7 C)   Resp 20   Ht 5' (152.4 cm)   Wt 88.9 kg   LMP 12/09/2017   SpO2 98%   Breastfeeding Unknown   BMI 38.28 kg/m  Patient discharged per MD orders. Discharge instructions reviewed with patient via Debarah Crape, Interpreter and patient verbalized understanding.  Prescriptions discussed with patient. Discharged via wheelchair escorted by nursing staff with infant.  Ron Parker, RN

## 2018-09-15 NOTE — Anesthesia Postprocedure Evaluation (Signed)
Anesthesia Post Note  Patient: Quincy Pizano-Gallardo  Procedure(s) Performed: AN AD HOC LABOR EPIDURAL  Patient location during evaluation: Mother Baby Anesthesia Type: Epidural Level of consciousness: awake and alert Pain management: pain level controlled Vital Signs Assessment: post-procedure vital signs reviewed and stable Respiratory status: spontaneous breathing, nonlabored ventilation and respiratory function stable Cardiovascular status: stable Postop Assessment: no headache, no backache and epidural receding Anesthetic complications: no     Last Vitals:  Vitals:   09/15/18 0434 09/15/18 0816  BP: 112/68 111/81  Pulse: 71 (!) 59  Resp: 18 20  Temp: 36.8 C 36.7 C  SpO2: 97% 98%    Last Pain:  Vitals:   09/15/18 0808  TempSrc:   PainSc: 0-No pain                 Girl Schissler,  Alessandra Bevels

## 2018-09-15 NOTE — Progress Notes (Addendum)
Post Partum Day 1 Seen with spanish interpreter  Subjective: "anus hurts" No bowel movement since delivery. Bleeding normal. No other pain  Objective: Blood pressure 111/81, pulse (!) 59, temperature 98 F (36.7 C), resp. rate 20, height 5' (1.524 m), weight 88.9 kg, last menstrual period 12/09/2017, SpO2 98 %, unknown if currently breastfeeding.  Physical Exam:  General: alert and cooperative Lochia: appropriate Uterine Fundus: firm Anus: no evidence tearing or infection, normal lochia, small perineal tear DVT Evaluation: No evidence of DVT seen on physical exam.  Recent Labs    09/14/18 0504 09/15/18 0514  HGB 11.0* 10.6*  HCT 32.8* 31.7*    Assessment/Plan: Plan for discharge tomorrow and Breastfeeding  -Acute blood loss anemia - hemodynamically stable and asymptomatic; start PO ferrous sulfate BID with stool softeners and ascorbic acid.    LOS: 1 day   Christeen Douglas 09/15/2018, 2:13 PM

## 2018-09-16 LAB — RPR: RPR Ser Ql: NONREACTIVE

## 2018-10-25 DIAGNOSIS — Z87898 Personal history of other specified conditions: Secondary | ICD-10-CM

## 2018-10-25 DIAGNOSIS — N200 Calculus of kidney: Secondary | ICD-10-CM

## 2018-10-25 DIAGNOSIS — F4321 Adjustment disorder with depressed mood: Secondary | ICD-10-CM

## 2018-10-25 DIAGNOSIS — Z62810 Personal history of physical and sexual abuse in childhood: Secondary | ICD-10-CM

## 2018-10-26 ENCOUNTER — Other Ambulatory Visit: Payer: Self-pay

## 2018-10-26 ENCOUNTER — Ambulatory Visit (LOCAL_COMMUNITY_HEALTH_CENTER): Payer: BC Managed Care – PPO

## 2018-10-26 ENCOUNTER — Encounter: Payer: Self-pay | Admitting: Physician Assistant

## 2018-10-26 VITALS — BP 130/84 | Ht 61.0 in | Wt 181.0 lb

## 2018-10-26 DIAGNOSIS — Z30430 Encounter for insertion of intrauterine contraceptive device: Secondary | ICD-10-CM

## 2018-10-26 DIAGNOSIS — Z3009 Encounter for other general counseling and advice on contraception: Secondary | ICD-10-CM | POA: Diagnosis not present

## 2018-10-26 MED ORDER — PARAGARD INTRAUTERINE COPPER IU IUD
1.0000 | INTRAUTERINE_SYSTEM | Freq: Once | INTRAUTERINE | Status: AC
  Administered 2018-10-26: 1 via INTRAUTERINE

## 2018-10-26 NOTE — Progress Notes (Signed)
  Subjective:     Patient ID: Gina Bell, female   DOB: 1975-11-11, 43 y.o.   MRN: [id number]  HPI  43l yo here for PP and paragard IUD today.  Denies sexual activity since delivery on 09/14/2018.  Client states she has used this IUD in the past.  States her first LMP since delivery began 10/26/2018.   Client denies complaints.  She is breastfeeding.   Review of Systems    BP 130/84   Ht 5\' 1"  (1.549 m)   Wt 181 lb (82.1 kg)   LMP 10/26/2018 (Exact Date) Comment: normal  Breastfeeding Yes   BMI 34.20 kg/m   Objective:   Physical Exam Vitals signs reviewed.  Genitourinary:    Exam position: Lithotomy position.    Patient presented to [facility] for IUD insertion. Her GC/CT screening was found to be up to date and using WHO criteria we can be reasonably certain she is not pregnant.  See Flowsheet for IUD check list  IUD Insertion Procedure Note Patient identified, informed consent performed, consent signed.   Discussed risks of irregular bleeding, cramping, infection, malpositioning or misplacement of the IUD outside the uterus which may require further procedure such as laparoscopy. Time out was performed.  Urine pregnancy test negative.  Speculum placed in the vagina.  Cervix visualized.  Cleaned with Betadine x 2.  Grasped anteriorly with a single tooth tenaculum.  Uterus sounded to 9 cm.  IUD placed per manufacturer's recommendations.  Strings trimmed to 3 cm. Tenaculum was removed, good hemostasis noted.  Patient tolerated procedure well.   Patient was given post-procedure instructions.  She was advised to have backup contraception for one week.  Patient was also asked to check IUD strings periodically or follow up in 4 weeks for IUD check.    Assessment:     Post Partum IUD insertion    Plan:    Client needs to complete [place] and DV assessment at next visit. Co. To use back-up method 1 wk. Return to clinic for problems prn.

## 2018-10-26 NOTE — Progress Notes (Signed)
Pt states she gave birth on 09/14/2018 and has not had a PP exam anywhere. Pt states she is here for IUD, no hormone IUD. Pt states last PAP was in [place] in May 2019 and was normal.

## 2018-12-09 MED ORDER — PARAGARD INTRAUTERINE COPPER IU IUD: 1.0000 | INTRAUTERINE_SYSTEM | Freq: Once | INTRAUTERINE | Status: AC

## 2018-12-09 NOTE — Addendum Note (Signed)
Addended by: Hassell Done on: 12/09/2018 08:15 AM   Modules accepted: Orders

## 2019-05-20 IMAGING — US US OB COMP LESS 14 WK
1 series · 14 of 28 positions shown · non-contrast
Comparison: None.

CLINICAL DATA: Initial evaluation for early pregnancy, dating.

EXAM:
OBSTETRIC <14 WK US AND TRANSVAGINAL OB US
TECHNIQUE: Both transabdominal and transvaginal ultrasound examinations were
performed for complete evaluation of the gestation as well as the
maternal uterus, adnexal regions, and pelvic cul-de-sac.
Transvaginal technique was performed to assess early pregnancy.

[Series 1: us ob comp less 14 wk · 0.16mm/px · 14 of 55 slices shown]
[im 3/55]
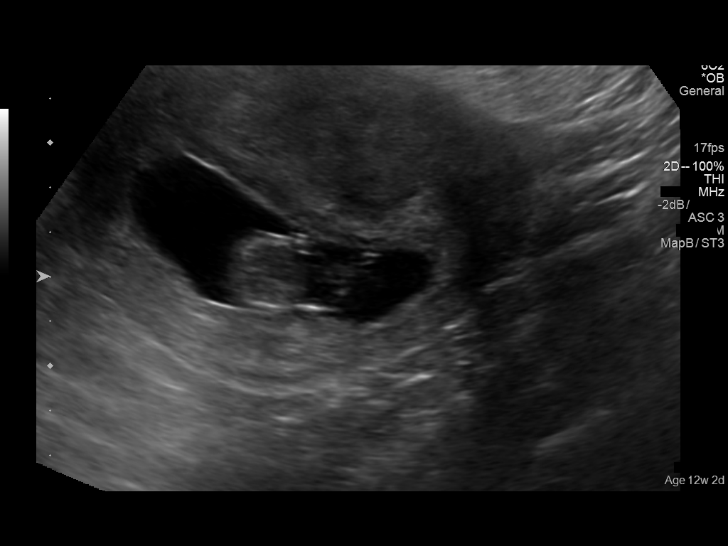
[im 7/55]
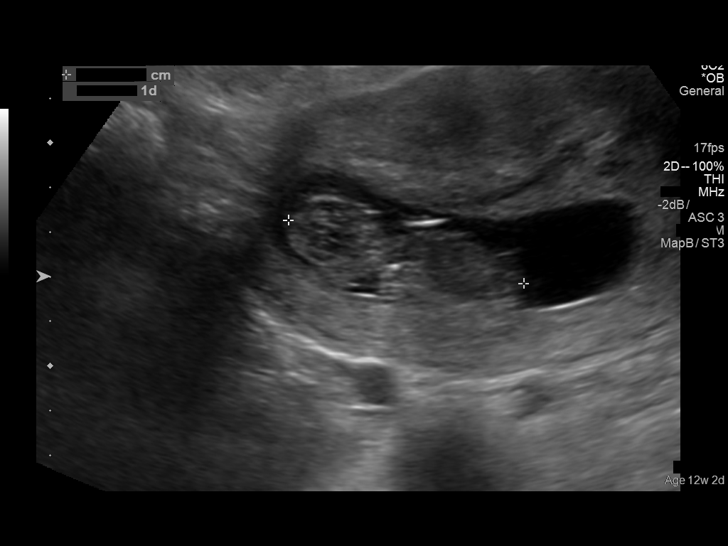
[im 11/55]
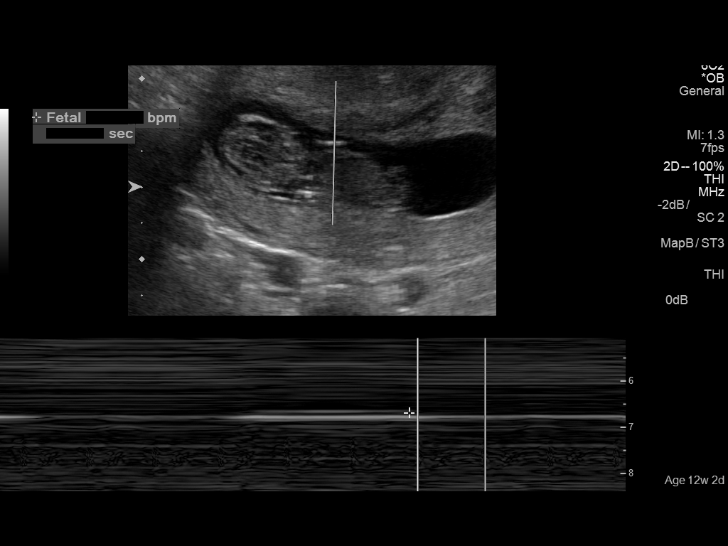
[im 15/55]
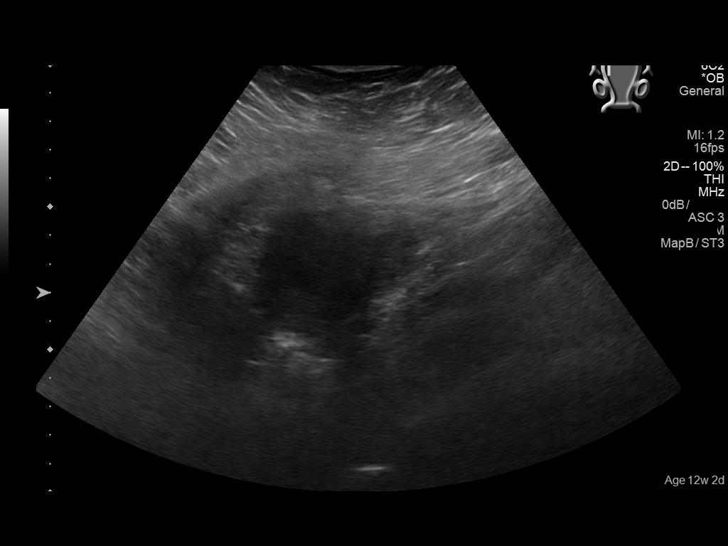
[im 19/55]
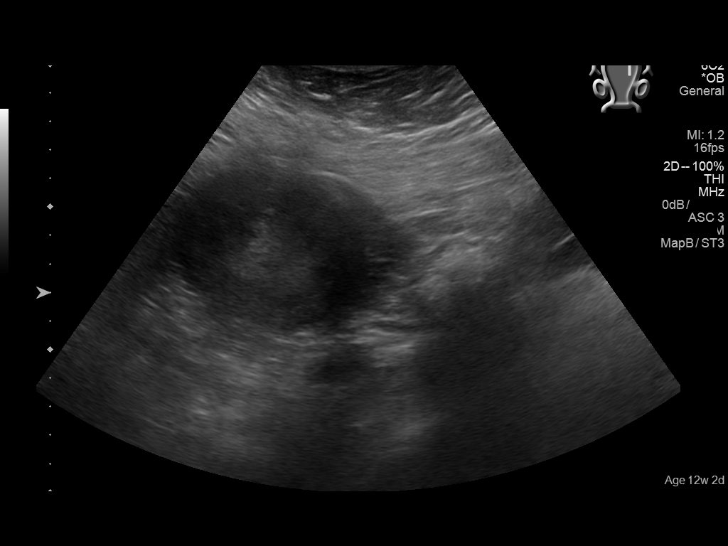
[im 23/55]
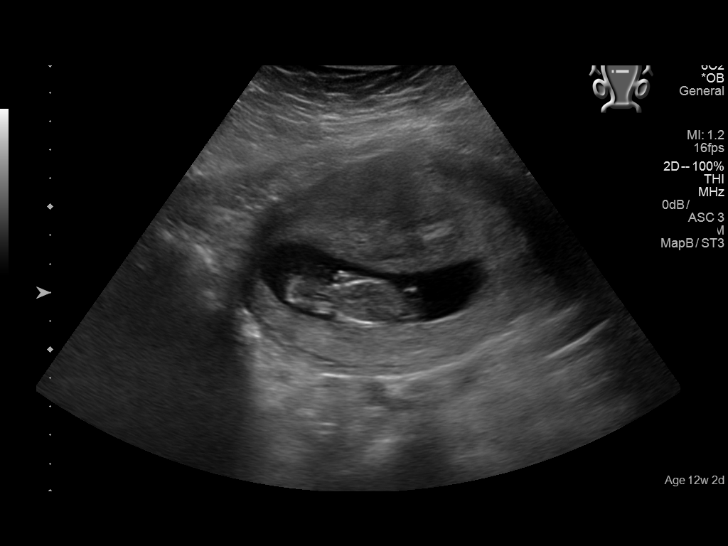
[im 27/55]
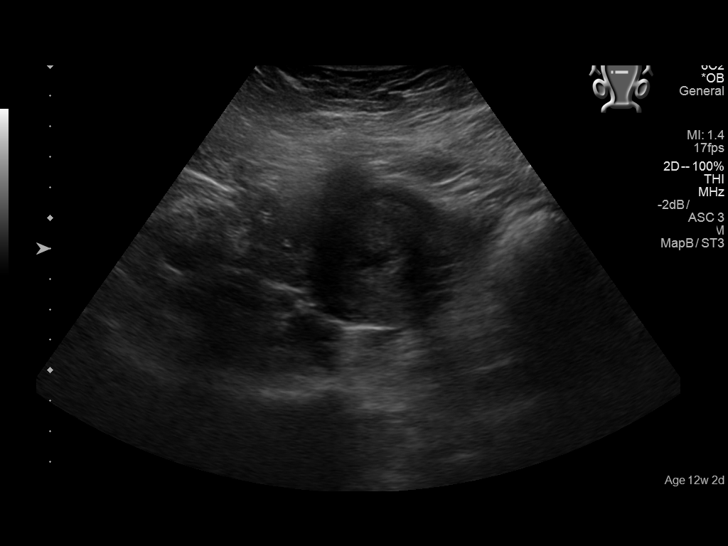
[im 31/55]
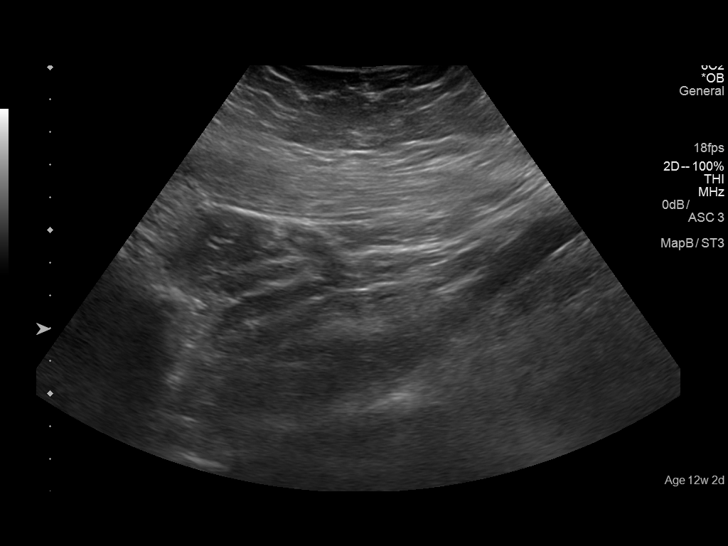
[im 35/55]
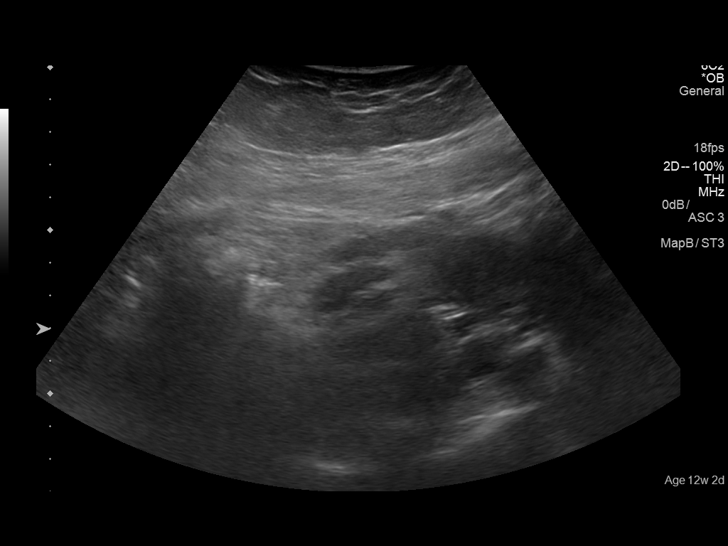
[im 39/55]
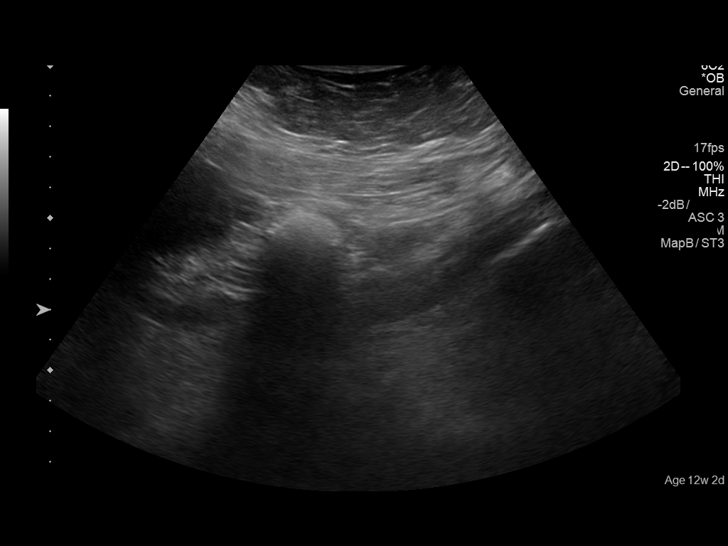
[im 43/55]
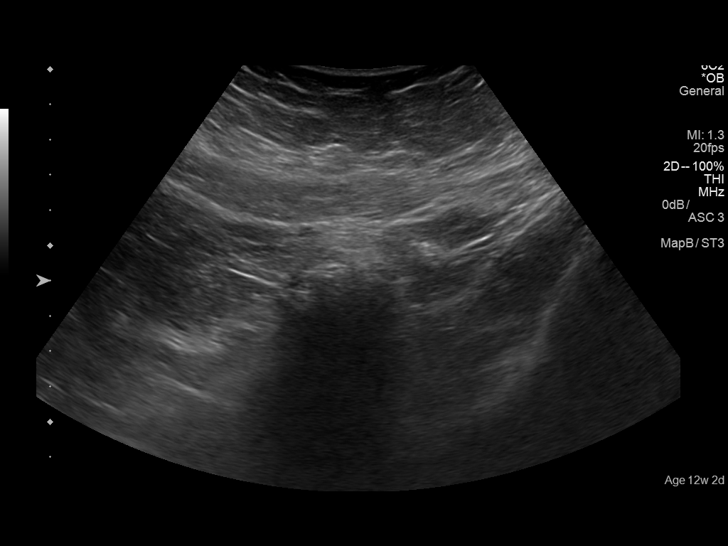
[im 47/55]
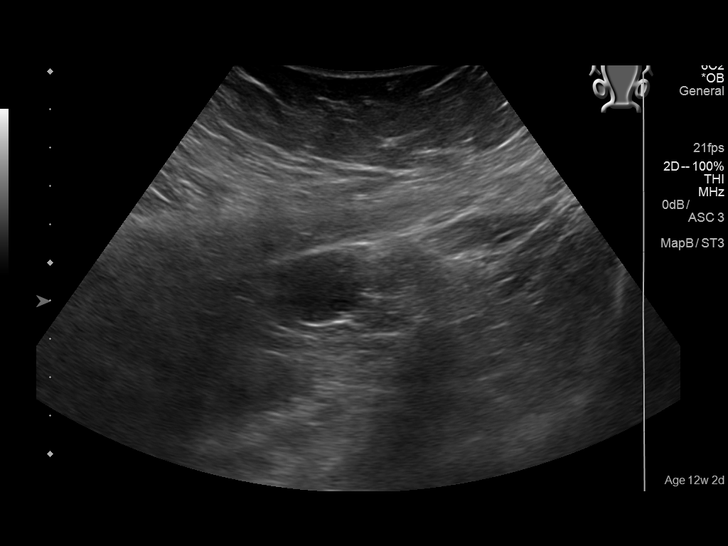
[im 51/55]
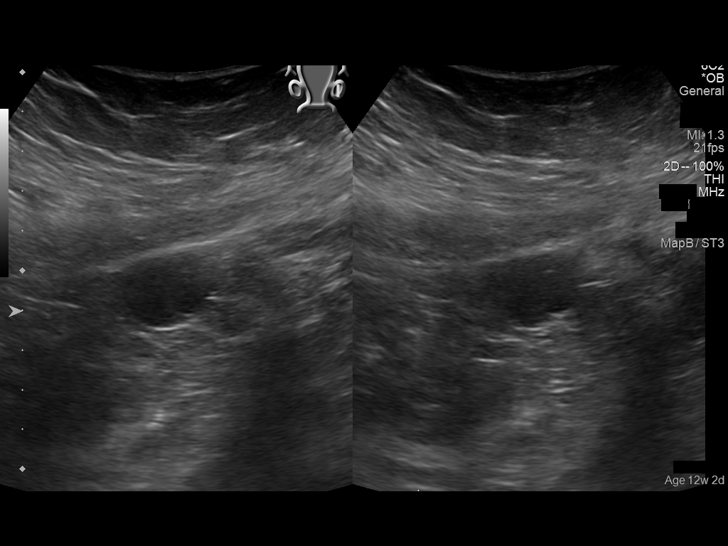
[im 55/55]
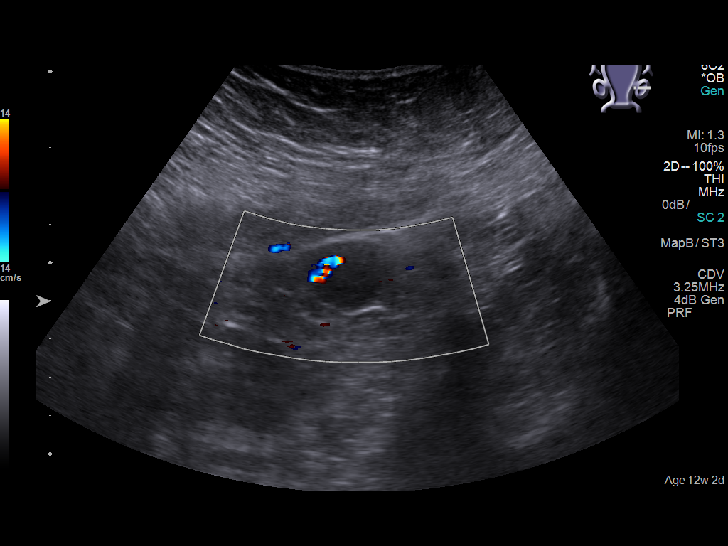

[14 of 28 positions shown; findings below may reference images not displayed]

FINDINGS: Intrauterine gestational sac: Single

Yolk sac:  Not visualized.

Embryo:  Present

Cardiac Activity: Present

Heart Rate: 152 bpm

CRL: 54.9 mm   12 w   1 d                  US EDC: 09/15/2018

Subchorionic hemorrhage:  None visualized.

Maternal uterus/adnexae: Right ovary not visualized. Left ovary
normal in appearance. No adnexal mass. No free fluid within the
pelvis.
IMPRESSION: 1. Single live intrauterine pregnancy without complication,
estimated gestational age 12 weeks and 1 day by crown-rump length,
with ultrasound EDC of 09/15/2018.
2. No other acute maternal uterine or adnexal abnormality
identified.

## 2019-06-01 IMAGING — US US MFM OB COMPLETE +14 WKS
1 series · 13 of 28 positions shown · non-contrast
Comparison: none

PATIENT INFO:

             [email or web address]
PERFORMED BY:
                   SOBE MAJA CNM
SERVICE(S) PROVIDED:
  US MFM OB COMP LESS THAN 14 WEEKS                    76801.4
 ----------------------------------------------------------------------
INDICATIONS:
  14 weeks gestation of pregnancy
FETAL EVALUATION:
 Num Of Fetuses:         1
 Fetal Heart Rate(bpm):  152
 Cardiac Activity:       Present
 Presentation:           Vertex
 Placenta:               Anterior
BIOMETRY:
 CRL:      79.7  mm     G. Age:  14w 0d                  EDD:   09/14/18
GESTATIONAL AGE:
 LMP:           14w 0d        Date:  12/08/17                 EDD:   09/14/18
 Best:          14w 0d     Det. By:  LMP  (12/08/17)          EDD:   09/14/18
CERVIX UTERUS ADNEXA:
 Cervix
 Length:           5.47  cm.

[Series 1: us mfm ob complete +14 wks · 0.23mm/px · 54 acquisitions, 13 frames shown]
[im 2/54]
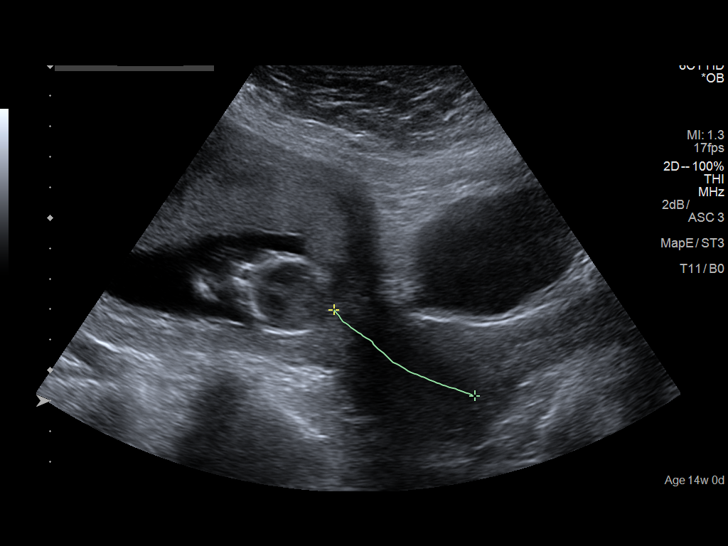
[im 6/54]
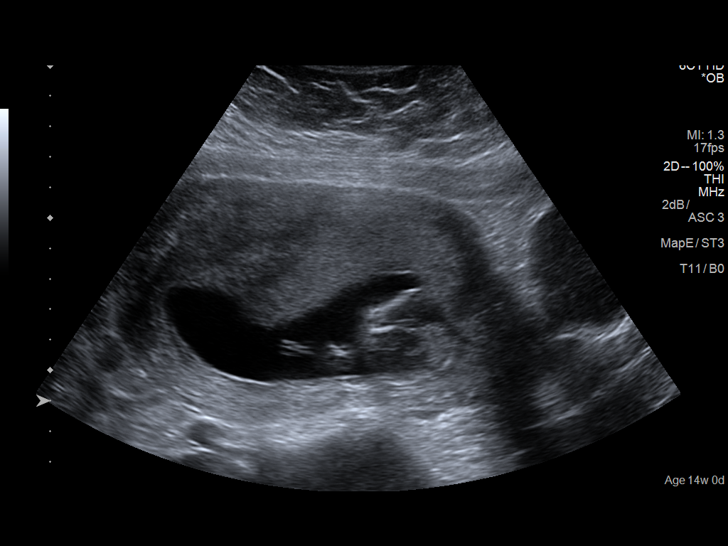
[im 10/54]
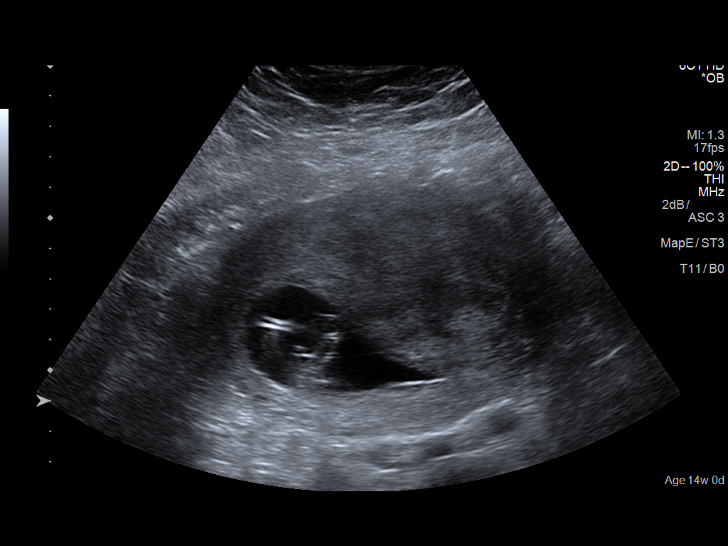
[im 14/54]
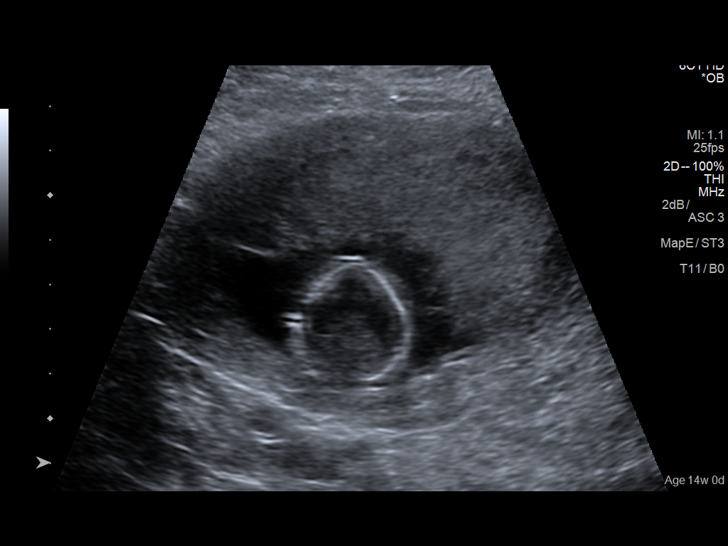
[im 18/54]
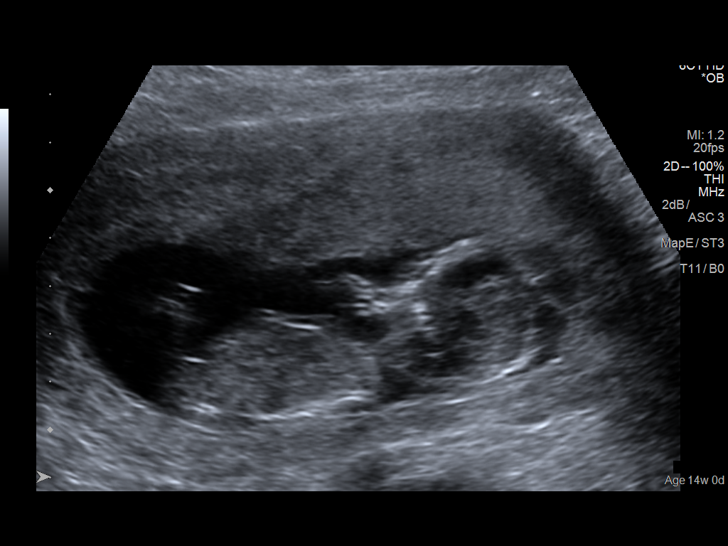
[im 22/54]
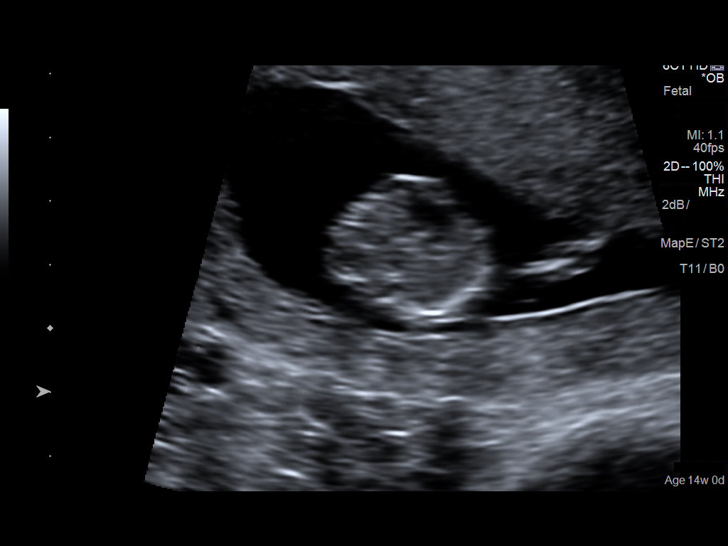
[im 28/54]
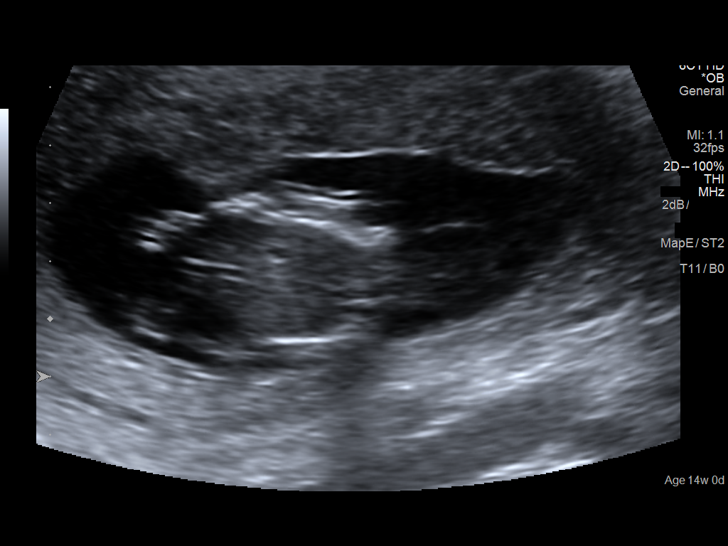
[im 32/54]
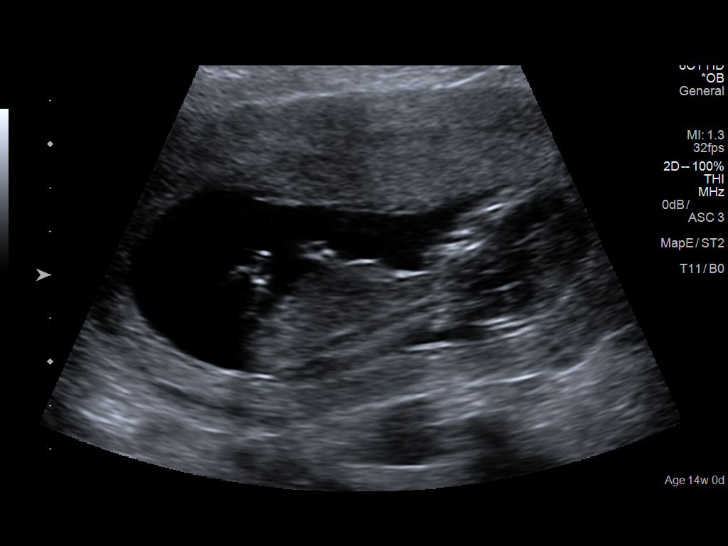
[im 36/54]
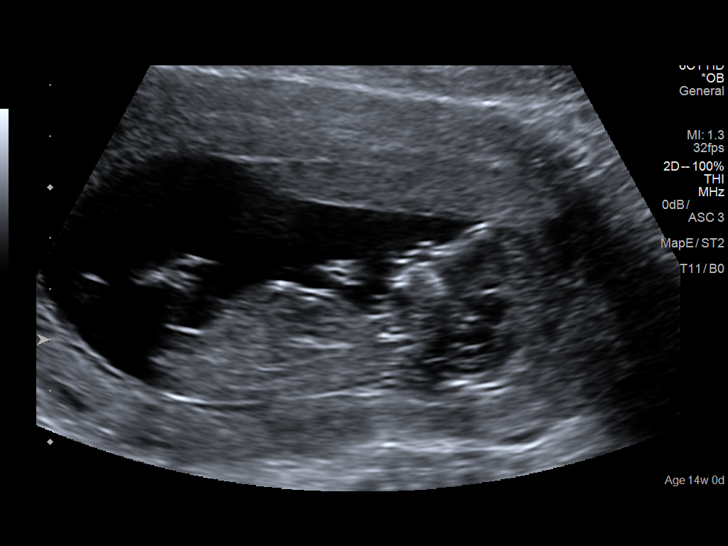
[im 40/54]
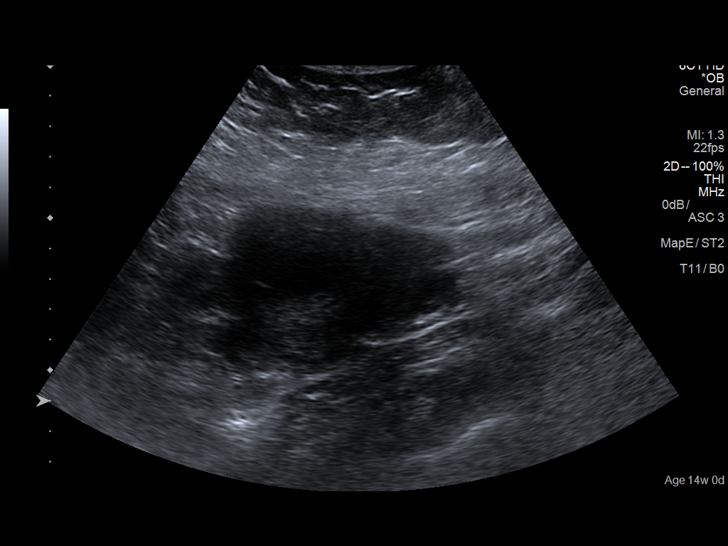
[im 44/54]
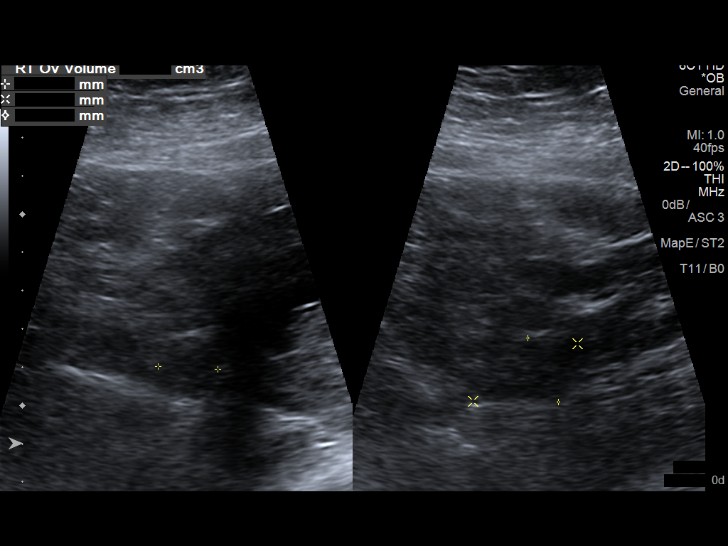
[im 48/54]
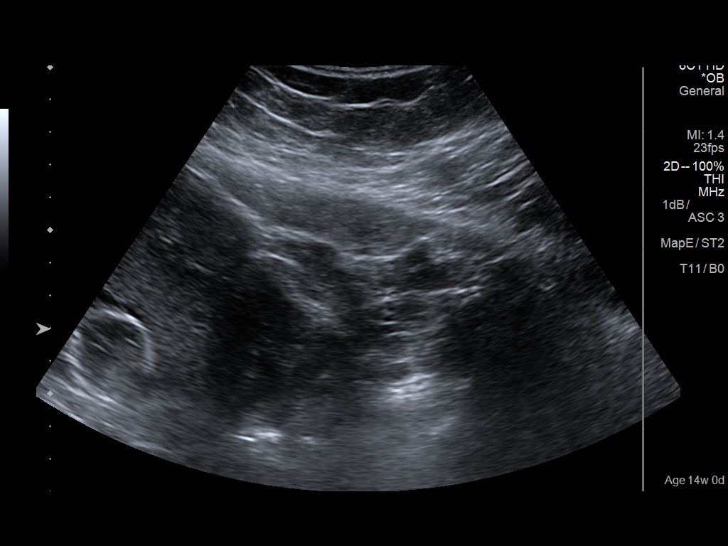
[im 52/54]
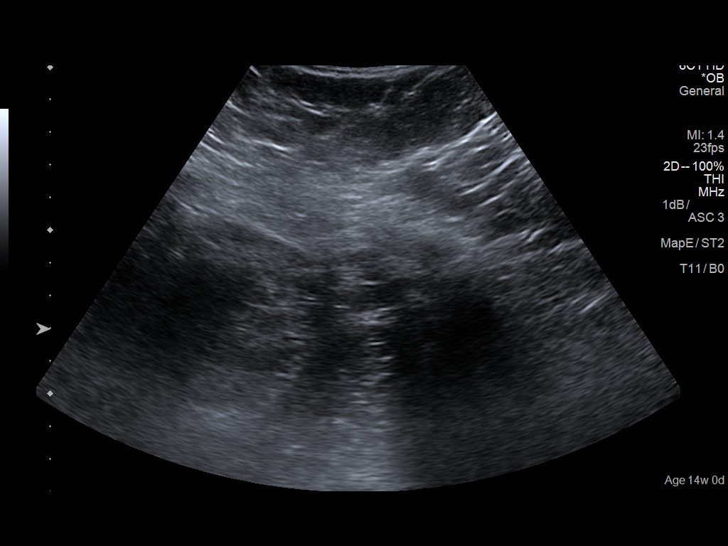

[13 of 28 positions shown; findings below may reference images not displayed]

IMPRESSION: Thank you for referring your patient for first trimester
 screening due to advanced maternal age.  She had the
 opportunity to meet with our genetic counselor and elected to
 have cell free fetal DNA screening performed.
 Ultrasound demonstrates a single, live, fetus at 14 weeks 0
 days.  Dating is by LMP consistent with first trimester
 ultrasound.
 Images of the symmetric choroid plexus, extremities, bladder,
 and stomach appear unremarkable.
 She will have blood drawn today and we will contact her with
 the results.
                  [place], Mikhael

## 2019-06-09 IMAGING — US US RENAL
1 series · 14 of 25 positions shown · non-contrast
Comparison: None.

CLINICAL DATA: Right flank pain.

EXAM:
RENAL / URINARY TRACT ULTRASOUND COMPLETE

[Series 1: us renal · 0.28mm/px · 14 of 49 slices shown]
[im 1/49]
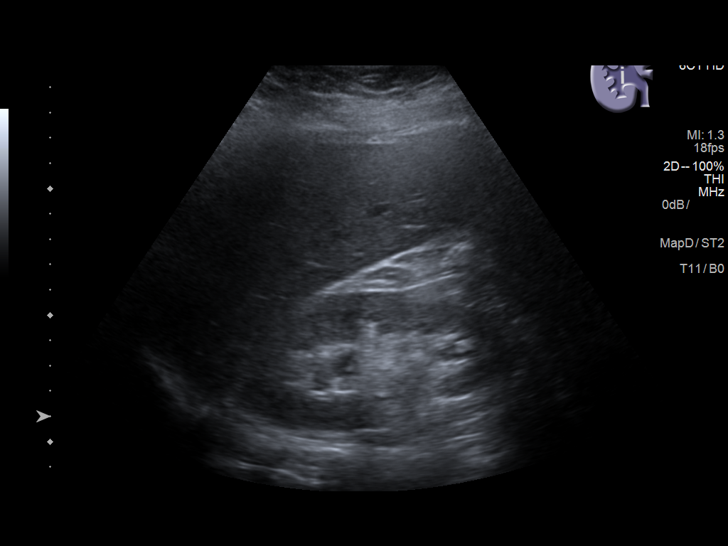
[im 5/49]
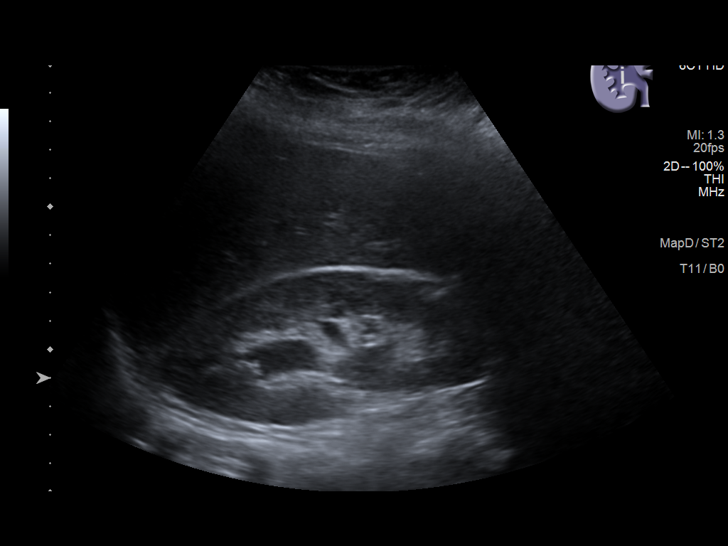
[im 9/49]
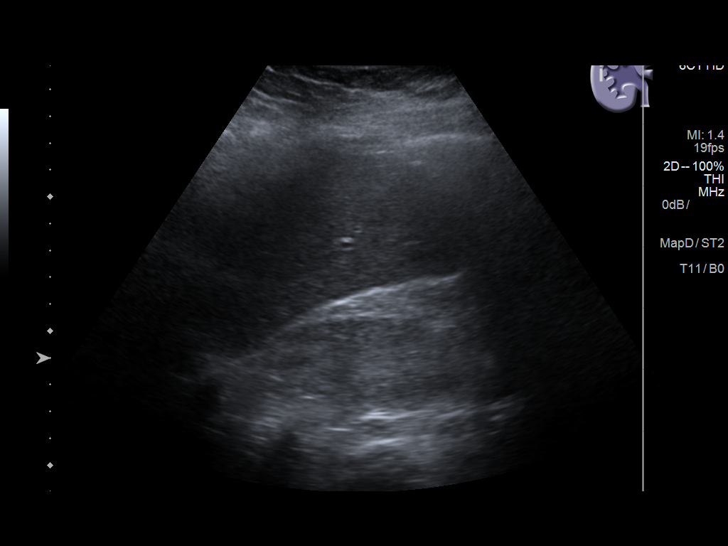
[im 13/49]
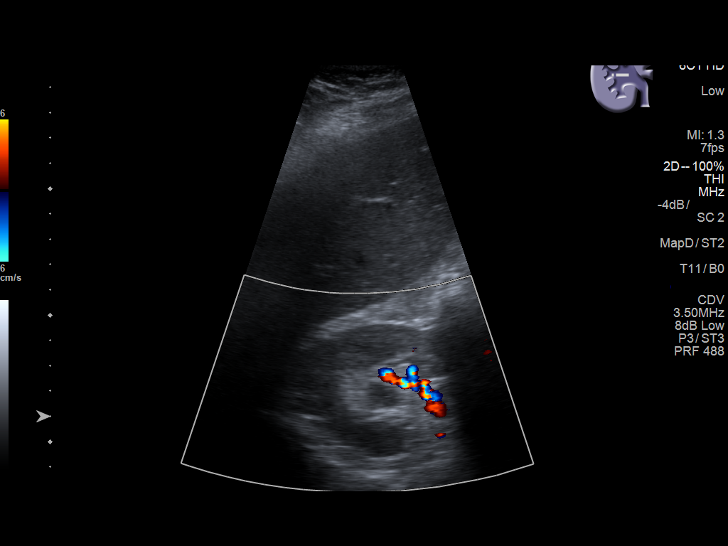
[im 17/49]
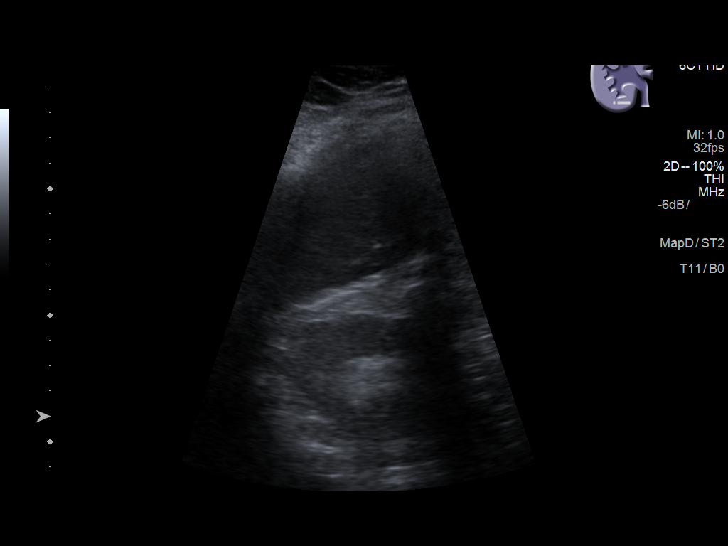
[im 19/49]
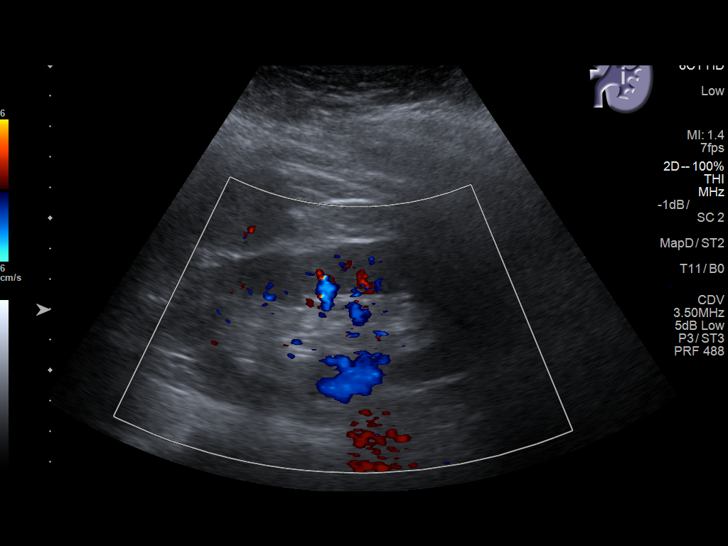
[im 23/49]
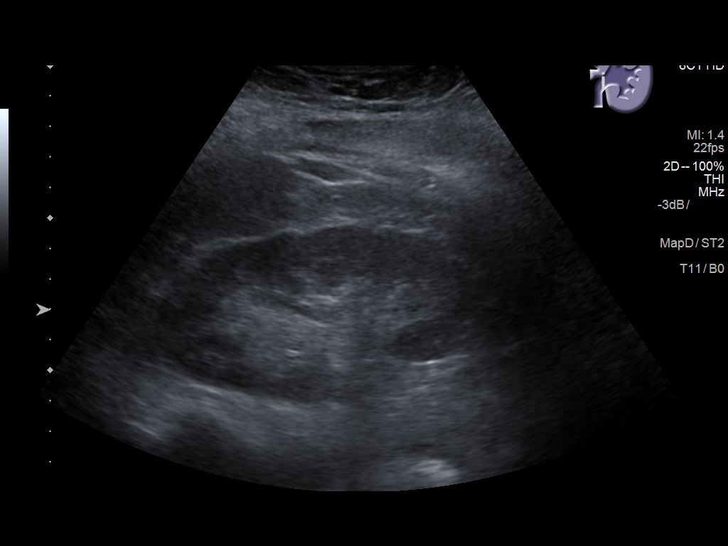
[im 27/49]
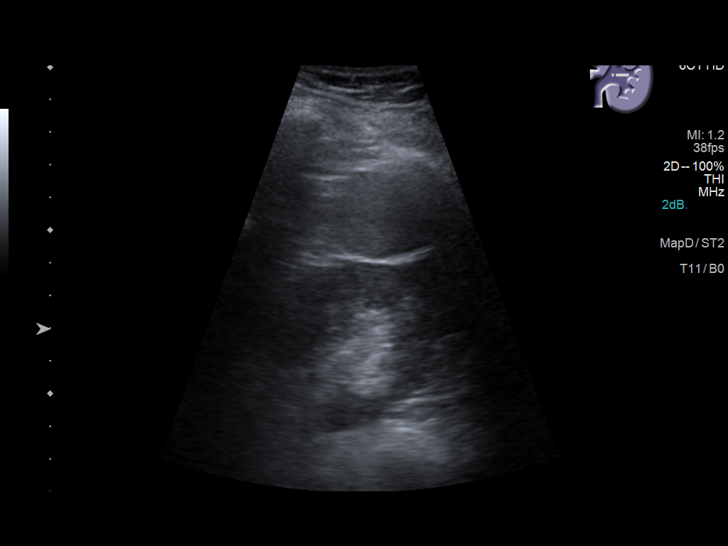
[im 31/49]
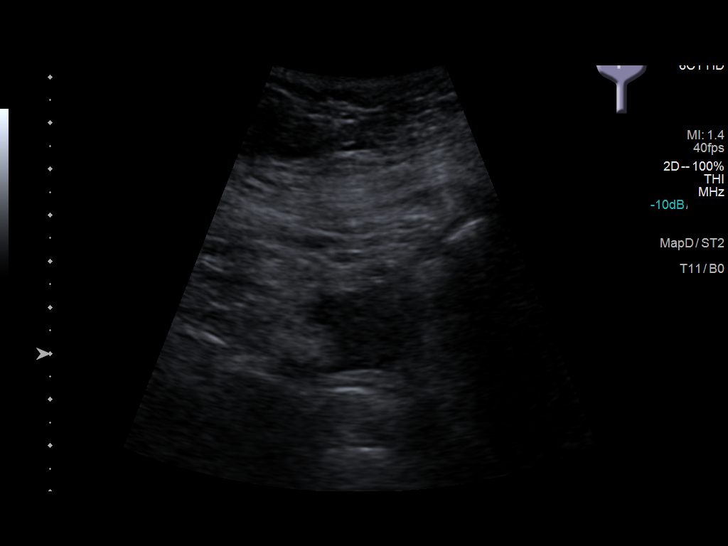
[im 33/49]
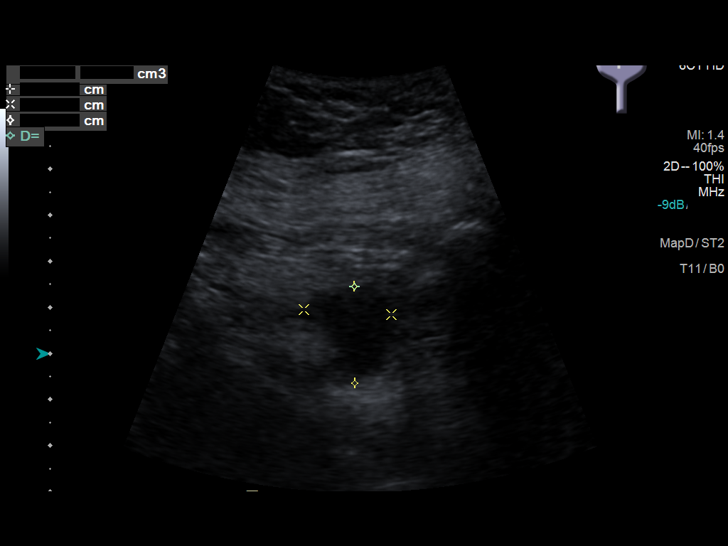
[im 37/49]
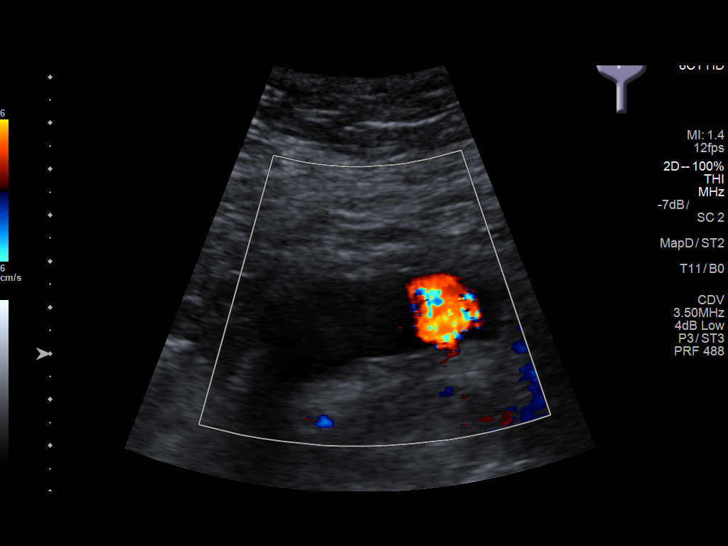
[im 41/49]
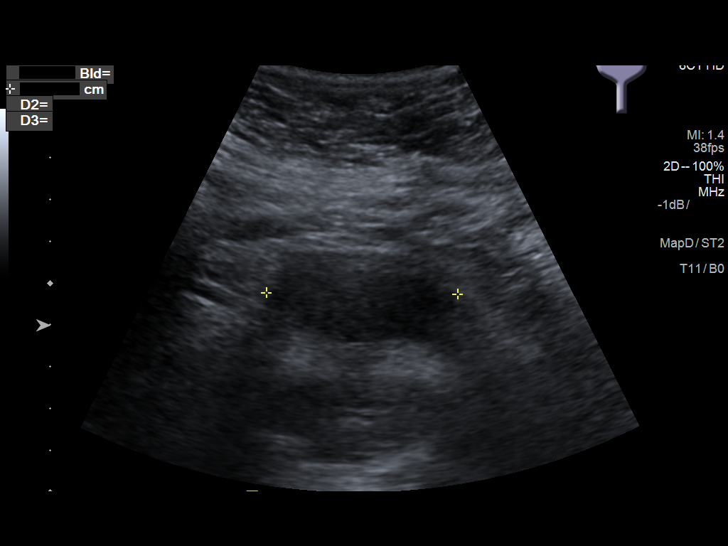
[im 45/49]
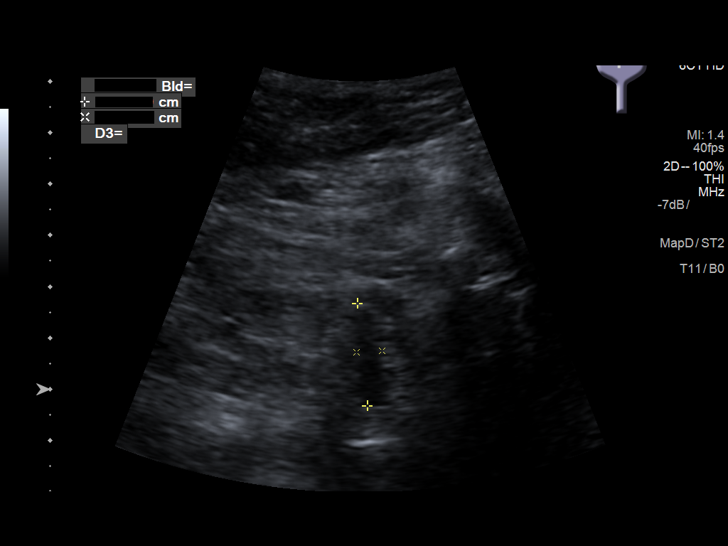
[im 49/49]
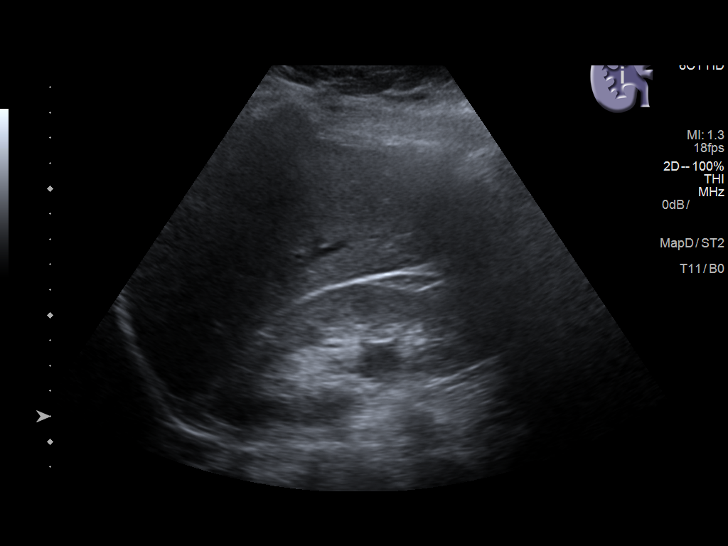

[14 of 25 positions shown; findings below may reference images not displayed]

FINDINGS: Right Kidney:

Renal measurements: 11.7 x 5.5 x 5.2 cm = volume: 174 mL .
Echogenicity within normal limits. No mass visualized. Mild right
hydronephrosis is noted.

Left Kidney:

Renal measurements: 11.5 x 5.3 x 4.9 cm = volume: 155 mL.
Echogenicity within normal limits. No mass or hydronephrosis
visualized.

Bladder:

Appears normal for degree of bladder distention. Calculated prevoid
volume of 10 mL. Calculated postvoid volume of 1 mL. Left ureteral
jet is noted. Right ureteral jet is not visualized.
IMPRESSION: Mild right hydronephrosis. Distal ureteral obstruction cannot be
excluded. CT urogram may be performed further evaluation.
